# Patient Record
Sex: Male | Born: 1955
Health system: Southern US, Community
[De-identification: ages and names within clinical notes are randomized; demographics above are authoritative.]

## PROBLEM LIST (undated history)

## (undated) DIAGNOSIS — K449 Diaphragmatic hernia without obstruction or gangrene: Secondary | ICD-10-CM

## (undated) DIAGNOSIS — Z87828 Personal history of other (healed) physical injury and trauma: Secondary | ICD-10-CM

## (undated) DIAGNOSIS — M1990 Unspecified osteoarthritis, unspecified site: Secondary | ICD-10-CM

## (undated) DIAGNOSIS — I10 Essential (primary) hypertension: Secondary | ICD-10-CM

## (undated) HISTORY — PX: ROTATOR CUFF REPAIR: SHX139

## (undated) HISTORY — PX: APPENDECTOMY: SHX54

---

## 2003-02-11 ENCOUNTER — Emergency Department (HOSPITAL_COMMUNITY): Admission: EM | Admit: 2003-02-11 | Discharge: 2003-02-11 | Payer: Self-pay | Admitting: Emergency Medicine

## 2003-02-11 ENCOUNTER — Encounter: Payer: Self-pay | Admitting: Emergency Medicine

## 2003-04-24 ENCOUNTER — Encounter: Payer: Self-pay | Admitting: General Surgery

## 2003-04-24 ENCOUNTER — Ambulatory Visit (HOSPITAL_COMMUNITY): Admission: RE | Admit: 2003-04-24 | Discharge: 2003-04-24 | Payer: Self-pay | Admitting: General Surgery

## 2004-04-10 ENCOUNTER — Emergency Department (HOSPITAL_COMMUNITY): Admission: EM | Admit: 2004-04-10 | Discharge: 2004-04-10 | Payer: Self-pay | Admitting: Emergency Medicine

## 2008-02-29 ENCOUNTER — Emergency Department (HOSPITAL_COMMUNITY): Admission: EM | Admit: 2008-02-29 | Discharge: 2008-02-29 | Payer: Self-pay | Admitting: Emergency Medicine

## 2009-09-23 ENCOUNTER — Encounter: Payer: Self-pay | Admitting: Cardiology

## 2009-10-05 ENCOUNTER — Encounter: Payer: Self-pay | Admitting: Cardiology

## 2009-10-19 ENCOUNTER — Ambulatory Visit: Payer: Self-pay | Admitting: Cardiology

## 2009-10-19 DIAGNOSIS — K219 Gastro-esophageal reflux disease without esophagitis: Secondary | ICD-10-CM

## 2009-10-19 DIAGNOSIS — R071 Chest pain on breathing: Secondary | ICD-10-CM | POA: Insufficient documentation

## 2009-10-19 DIAGNOSIS — F17200 Nicotine dependence, unspecified, uncomplicated: Secondary | ICD-10-CM

## 2009-10-19 DIAGNOSIS — E7800 Pure hypercholesterolemia, unspecified: Secondary | ICD-10-CM | POA: Insufficient documentation

## 2009-10-19 DIAGNOSIS — R079 Chest pain, unspecified: Secondary | ICD-10-CM | POA: Insufficient documentation

## 2011-09-11 LAB — D-DIMER, QUANTITATIVE: D-Dimer, Quant: 0.22

## 2011-09-11 LAB — BASIC METABOLIC PANEL
BUN: 11
CO2: 27
Chloride: 106
Creatinine, Ser: 1.05
Glucose, Bld: 102 — ABNORMAL HIGH

## 2011-09-11 LAB — DIFFERENTIAL
Basophils Absolute: 0
Basophils Relative: 1
Eosinophils Absolute: 0.1
Monocytes Absolute: 0.4
Neutrophils Relative %: 50

## 2011-09-11 LAB — CBC
MCHC: 35
MCV: 89.7
Platelets: 217
RDW: 12.2

## 2011-09-11 LAB — POCT CARDIAC MARKERS: Operator id: 228551

## 2012-01-18 ENCOUNTER — Other Ambulatory Visit (HOSPITAL_COMMUNITY): Payer: Self-pay | Admitting: Family Medicine

## 2012-01-18 ENCOUNTER — Ambulatory Visit (HOSPITAL_COMMUNITY)
Admission: RE | Admit: 2012-01-18 | Discharge: 2012-01-18 | Disposition: A | Discharge status: Home / Self Care | Payer: Medicaid Other | Source: Ambulatory Visit | Attending: Family Medicine | Admitting: Family Medicine

## 2012-01-18 DIAGNOSIS — G8929 Other chronic pain: Secondary | ICD-10-CM

## 2012-01-18 DIAGNOSIS — S8990XA Unspecified injury of unspecified lower leg, initial encounter: Secondary | ICD-10-CM | POA: Insufficient documentation

## 2012-01-18 DIAGNOSIS — M898X9 Other specified disorders of bone, unspecified site: Secondary | ICD-10-CM | POA: Insufficient documentation

## 2012-01-18 DIAGNOSIS — W19XXXA Unspecified fall, initial encounter: Secondary | ICD-10-CM | POA: Insufficient documentation

## 2012-01-18 DIAGNOSIS — M25569 Pain in unspecified knee: Secondary | ICD-10-CM | POA: Insufficient documentation

## 2012-01-18 DIAGNOSIS — M25469 Effusion, unspecified knee: Secondary | ICD-10-CM | POA: Insufficient documentation

## 2012-02-17 ENCOUNTER — Emergency Department (HOSPITAL_COMMUNITY)
Admission: EM | Admit: 2012-02-17 | Discharge: 2012-02-17 | Disposition: A | Discharge status: Home / Self Care | Payer: Self-pay | Attending: Emergency Medicine | Admitting: Emergency Medicine

## 2012-02-17 ENCOUNTER — Encounter (HOSPITAL_COMMUNITY): Payer: Self-pay | Admitting: *Deleted

## 2012-02-17 ENCOUNTER — Emergency Department (HOSPITAL_COMMUNITY): Payer: Self-pay

## 2012-02-17 DIAGNOSIS — R0789 Other chest pain: Secondary | ICD-10-CM

## 2012-02-17 DIAGNOSIS — IMO0001 Reserved for inherently not codable concepts without codable children: Secondary | ICD-10-CM | POA: Insufficient documentation

## 2012-02-17 DIAGNOSIS — E119 Type 2 diabetes mellitus without complications: Secondary | ICD-10-CM | POA: Insufficient documentation

## 2012-02-17 DIAGNOSIS — R071 Chest pain on breathing: Secondary | ICD-10-CM | POA: Insufficient documentation

## 2012-02-17 LAB — CBC
HCT: 47.4 % (ref 39.0–52.0)
Hemoglobin: 16.4 g/dL (ref 13.0–17.0)
MCH: 30.7 pg (ref 26.0–34.0)
MCHC: 34.6 g/dL (ref 30.0–36.0)
MCV: 88.6 fL (ref 78.0–100.0)
Platelets: 234 10*3/uL (ref 150–400)
RBC: 5.35 MIL/uL (ref 4.22–5.81)
RDW: 12.2 % (ref 11.5–15.5)
WBC: 4.6 10*3/uL (ref 4.0–10.5)

## 2012-02-17 LAB — DIFFERENTIAL
Basophils Absolute: 0 10*3/uL (ref 0.0–0.1)
Eosinophils Relative: 1 % (ref 0–5)
Lymphocytes Relative: 36 % (ref 12–46)
Monocytes Absolute: 0.4 10*3/uL (ref 0.1–1.0)

## 2012-02-17 LAB — TROPONIN I: Troponin I: <0.3 ng/mL (ref <0.30)

## 2012-02-17 LAB — BASIC METABOLIC PANEL
BUN: 12 mg/dL (ref 6–23)
CO2: 27 mEq/L (ref 19–32)
Calcium: 10.4 mg/dL (ref 8.4–10.5)
Chloride: 100 mEq/L (ref 96–112)
Creatinine, Ser: 1.01 mg/dL (ref 0.50–1.35)
GFR calc Af Amer: >90 mL/min (ref >90)
GFR calc non Af Amer: 81 mL/min — ABNORMAL LOW (ref >90)
Glucose, Bld: 110 mg/dL — ABNORMAL HIGH (ref 70–99)
Potassium: 3.8 mEq/L (ref 3.5–5.1)
Sodium: 136 mEq/L (ref 135–145)

## 2012-02-17 MED ORDER — IBUPROFEN 800 MG PO TABS
800.0000 mg | ORAL_TABLET | Freq: Once | ORAL | Status: AC
  Administered 2012-02-17: 800 mg via ORAL
  Filled 2012-02-17: qty 1

## 2012-02-17 MED ORDER — IBUPROFEN 800 MG PO TABS: 800.0000 mg | ORAL_TABLET | Freq: Three times a day (TID) | ORAL | Status: AC

## 2012-02-17 MED ORDER — HYDROCODONE-ACETAMINOPHEN 5-325 MG PO TABS: 1.0000 | ORAL_TABLET | ORAL | Status: AC | PRN

## 2012-02-17 MED ORDER — HYDROCODONE-ACETAMINOPHEN 5-325 MG PO TABS
1.0000 | ORAL_TABLET | Freq: Once | ORAL | Status: AC
  Administered 2012-02-17: 1 via ORAL
  Filled 2012-02-17: qty 1

## 2012-02-17 NOTE — Discharge Instructions (Signed)
Chest Wall Pain Chest wall pain is pain in or around the bones and muscles of your chest. This may occur:   On its own (spontaneously).   After a viral illness such as the flu.   Through injur.   From coughing.   Minor exercise.  It may take up to 6 weeks to get better; longer if you must stay physically active in your work and activities. HOME CARE INSTRUCTIONS   Avoid over-tiring physical activity. Try not to strain or perform activities which cause pain. This would include any activities using chest, belly (abdominal) and side muscles, especially if heavy weights are used.   Use ice on the painful area for 15 to 20 minutes per hour while awake for the first 2 days. Place the ice in a plastic bag and place a towel between the bag of ice and your skin.   Only take over-the-counter or prescription medicines for pain, discomfort, or fever as directed by your caregiver.  SEEK IMMEDIATE MEDICAL CARE IF:   Your pain increases or you are very uncomfortable.   An oral temperature above 102 F (38.9 C)develops.   Your chest pains become worse.   You develop new, unexplained problems (symptoms).   You develop nausea, vomiting, sweating or feel light headed.   You develop a cough which produces phlegm (sputum) or you cough up blood.  MAKE SURE YOU:   Understand these instructions.   Will watch your condition.   Will get help right away if you are not doing well or get worse.  Document Released: 12/04/2005 Document Revised: 06/19/2011 Document Reviewed: 07/22/2008 [employer] Patient Information 2012 [employer]. 
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
## 2012-02-17 NOTE — ED Notes (Signed)
Pt c/o left sided chest pain that radiates into left axilla, and into sternum.  Pt refers to pain as sharp and pressure like, starting 3 weeks ago.  Pt c/o increased chest /epigastric pain while "toting wood today". Pt cooperative, denies N/V, diaphoresis, arm pain and sob.

## 2012-02-17 NOTE — ED Notes (Signed)
Intermittent CP x 2 weeks radiating to the back at times. Described as pressure pain

## 2012-12-13 ENCOUNTER — Encounter (HOSPITAL_COMMUNITY): Payer: Self-pay | Admitting: *Deleted

## 2012-12-13 ENCOUNTER — Emergency Department (HOSPITAL_COMMUNITY)
Admission: EM | Admit: 2012-12-13 | Discharge: 2012-12-13 | Disposition: A | Discharge status: Home / Self Care | Payer: Self-pay | Attending: Emergency Medicine | Admitting: Emergency Medicine

## 2012-12-13 ENCOUNTER — Emergency Department (HOSPITAL_COMMUNITY): Payer: Self-pay

## 2012-12-13 DIAGNOSIS — R112 Nausea with vomiting, unspecified: Secondary | ICD-10-CM | POA: Insufficient documentation

## 2012-12-13 DIAGNOSIS — E119 Type 2 diabetes mellitus without complications: Secondary | ICD-10-CM | POA: Insufficient documentation

## 2012-12-13 DIAGNOSIS — Z87828 Personal history of other (healed) physical injury and trauma: Secondary | ICD-10-CM | POA: Insufficient documentation

## 2012-12-13 DIAGNOSIS — R091 Pleurisy: Secondary | ICD-10-CM | POA: Insufficient documentation

## 2012-12-13 DIAGNOSIS — Z87891 Personal history of nicotine dependence: Secondary | ICD-10-CM | POA: Insufficient documentation

## 2012-12-13 DIAGNOSIS — Z8719 Personal history of other diseases of the digestive system: Secondary | ICD-10-CM | POA: Insufficient documentation

## 2012-12-13 DIAGNOSIS — Z79899 Other long term (current) drug therapy: Secondary | ICD-10-CM | POA: Insufficient documentation

## 2012-12-13 HISTORY — DX: Personal history of other (healed) physical injury and trauma: Z87.828

## 2012-12-13 HISTORY — DX: Diaphragmatic hernia without obstruction or gangrene: K44.9

## 2012-12-13 LAB — BASIC METABOLIC PANEL
BUN: 11 mg/dL (ref 6–23)
Creatinine, Ser: 0.93 mg/dL (ref 0.50–1.35)
GFR calc Af Amer: >90 mL/min (ref >90)
GFR calc non Af Amer: >90 mL/min (ref >90)

## 2012-12-13 LAB — CBC WITH DIFFERENTIAL/PLATELET
Basophils Relative: 1 % (ref 0–1)
Eosinophils Absolute: 0.2 10*3/uL (ref 0.0–0.7)
Eosinophils Relative: 4 % (ref 0–5)
HCT: 43.7 % (ref 39.0–52.0)
Hemoglobin: 15.2 g/dL (ref 13.0–17.0)
MCH: 30 pg (ref 26.0–34.0)
MCHC: 34.8 g/dL (ref 30.0–36.0)
MCV: 86.4 fL (ref 78.0–100.0)
Monocytes Absolute: 0.5 10*3/uL (ref 0.1–1.0)
Monocytes Relative: 10 % (ref 3–12)

## 2012-12-13 LAB — HEPATIC FUNCTION PANEL
AST: 25 U/L (ref 0–37)
Bilirubin, Direct: <0.1 mg/dL (ref 0.0–0.3)

## 2012-12-13 LAB — D-DIMER, QUANTITATIVE: D-Dimer, Quant: <0.27 ug/mL-FEU (ref 0.00–0.48)

## 2012-12-13 MED ORDER — HYDROCODONE-ACETAMINOPHEN 5-325 MG PO TABS: 1.0000 | ORAL_TABLET | Freq: Three times a day (TID) | ORAL | Status: DC | PRN

## 2012-12-13 MED ORDER — ONDANSETRON 4 MG PO TBDP
4.0000 mg | ORAL_TABLET | Freq: Once | ORAL | Status: AC
  Administered 2012-12-13: 4 mg via ORAL
  Filled 2012-12-13: qty 1

## 2012-12-13 MED ORDER — OXYCODONE-ACETAMINOPHEN 5-325 MG PO TABS
1.0000 | ORAL_TABLET | Freq: Once | ORAL | Status: AC
  Administered 2012-12-13: 1 via ORAL
  Filled 2012-12-13: qty 1

## 2012-12-13 MED ORDER — ONDANSETRON HCL 4 MG/2ML IJ SOLN
4.0000 mg | Freq: Once | INTRAMUSCULAR | Status: DC
  Filled 2012-12-13: qty 2

## 2012-12-13 NOTE — ED Notes (Signed)
MD at bedside. 
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
## 2012-12-13 NOTE — ED Notes (Signed)
Patient given ice chips per nurse's approval.
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
## 2012-12-20 ENCOUNTER — Encounter: Payer: Self-pay | Admitting: Cardiology

## 2012-12-20 ENCOUNTER — Other Ambulatory Visit: Payer: Self-pay | Admitting: *Deleted

## 2012-12-20 ENCOUNTER — Encounter: Payer: Self-pay | Admitting: *Deleted

## 2012-12-20 ENCOUNTER — Ambulatory Visit (INDEPENDENT_AMBULATORY_CARE_PROVIDER_SITE_OTHER): Payer: Self-pay | Admitting: Cardiology

## 2012-12-20 VITALS — BP 144/81 | HR 66 | Ht 76.0 in | Wt 215.0 lb

## 2012-12-20 DIAGNOSIS — F17200 Nicotine dependence, unspecified, uncomplicated: Secondary | ICD-10-CM

## 2012-12-20 DIAGNOSIS — R9431 Abnormal electrocardiogram [ECG] [EKG]: Secondary | ICD-10-CM

## 2012-12-20 DIAGNOSIS — R079 Chest pain, unspecified: Secondary | ICD-10-CM

## 2012-12-20 DIAGNOSIS — R072 Precordial pain: Secondary | ICD-10-CM | POA: Insufficient documentation

## 2012-12-20 DIAGNOSIS — E7800 Pure hypercholesterolemia, unspecified: Secondary | ICD-10-CM
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
## 2012-12-20 NOTE — Patient Instructions (Addendum)
Your physician has requested that you have a stress echocardiogram. For further information please visit [email or web address]. Please follow instruction sheet as given. Your physician recommends that you continue on your current medications as directed. Please refer to the Current Medication list given to you today. We will call you with your results. 

## 2012-12-24 DIAGNOSIS — R072 Precordial pain: Secondary | ICD-10-CM

## 2012-12-31 ENCOUNTER — Telehealth: Payer: Self-pay | Admitting: *Deleted

## 2012-12-31 ENCOUNTER — Encounter: Payer: Self-pay | Admitting: *Deleted

## 2012-12-31 DIAGNOSIS — R079 Chest pain, unspecified: Secondary | ICD-10-CM

## 2012-12-31 DIAGNOSIS — Z01810 Encounter for preprocedural cardiovascular examination: Secondary | ICD-10-CM

## 2012-12-31 NOTE — Telephone Encounter (Signed)
[facility] lab 01/09/13 @9 :30 am with Hochrein -CHECKING PERCERT      ZO:[id number] pain & abnormal stress test                [id number] pain & abnormal stress test

## 2012-12-31 NOTE — Telephone Encounter (Signed)
Message copied by Eustace Moore on Tue Dec 31, 2012  9:20 AM ------      Message from: Lorine Bears A      Created: Fri Dec 27, 2012 11:51 AM       Stress test was abnormal. I already notified Dr. Antoine Poche via a staff message. Please make sure that he is aware because I don't see that the issue has been addressed.

## 2012-12-31 NOTE — Telephone Encounter (Signed)
Message copied by Eustace Moore on Tue Dec 31, 2012  3:06 PM ------      Message from: Rollene Rotunda      Created: Tue Dec 31, 2012  2:30 PM       I spoke with the patient.  He is still getting some chest discomfort with activity. Given this and the abnormal result cardiac catheterization is indicated. The patient understands that risks included but are not limited to stroke (1 in 1000), death (1 in 1000), kidney failure [usually temporary] (1 in 500), bleeding (1 in 200), allergic reaction [possibly serious] (1 in 200).  The patient understands and agrees to proceed.  We will call to arrange.

## 2012-12-31 NOTE — Telephone Encounter (Signed)
Patient is calling for results of stress test. Spoke with MD and is said he would call patient today about the results. Patient informed that MD will be calling him to discuss results today.

## 2012-12-31 NOTE — Telephone Encounter (Signed)
Will you please set up a radial cath with me in the [facility] lab.    Radial  Heart cath in [facility] lab on 01/09/13 @9 :30 am with Dr. Antoine Poche arrive at 8:30 am. Patient informed and is coming to the office on Friday 01/03/13 @10 :00 am for lab orders and cath instructions.

## 2012-12-31 NOTE — Telephone Encounter (Signed)
Does pt have Medicaid or any other insurance?

## 2013-01-03 LAB — PROTIME-INR

## 2013-01-05 ENCOUNTER — Other Ambulatory Visit: Payer: Self-pay | Admitting: Cardiology

## 2013-01-05 DIAGNOSIS — R9439 Abnormal result of other cardiovascular function study: Secondary | ICD-10-CM

## 2013-01-09 ENCOUNTER — Encounter (HOSPITAL_BASED_OUTPATIENT_CLINIC_OR_DEPARTMENT_OTHER)
Admission: RE | Disposition: A | Discharge status: Home / Self Care | Payer: Self-pay | Source: Ambulatory Visit | Attending: Cardiology

## 2013-01-09 ENCOUNTER — Inpatient Hospital Stay (HOSPITAL_BASED_OUTPATIENT_CLINIC_OR_DEPARTMENT_OTHER)
Admission: RE | Admit: 2013-01-09 | Discharge: 2013-01-09 | Disposition: A | Discharge status: Home / Self Care | Payer: Self-pay | Source: Ambulatory Visit | Attending: Cardiology | Admitting: Cardiology

## 2013-01-09 DIAGNOSIS — Z79899 Other long term (current) drug therapy: Secondary | ICD-10-CM | POA: Insufficient documentation

## 2013-01-09 DIAGNOSIS — Z7902 Long term (current) use of antithrombotics/antiplatelets: Secondary | ICD-10-CM | POA: Insufficient documentation

## 2013-01-09 DIAGNOSIS — R0789 Other chest pain: Secondary | ICD-10-CM | POA: Insufficient documentation

## 2013-01-09 DIAGNOSIS — E119 Type 2 diabetes mellitus without complications: Secondary | ICD-10-CM | POA: Insufficient documentation

## 2013-01-09 DIAGNOSIS — R9439 Abnormal result of other cardiovascular function study: Secondary | ICD-10-CM

## 2013-01-09 DIAGNOSIS — R9430 Abnormal result of cardiovascular function study, unspecified: Secondary | ICD-10-CM

## 2013-01-09 DIAGNOSIS — Z7982 Long term (current) use of aspirin: Secondary | ICD-10-CM | POA: Insufficient documentation

## 2013-01-09 DIAGNOSIS — I2510 Atherosclerotic heart disease of native coronary artery without angina pectoris: Secondary | ICD-10-CM | POA: Insufficient documentation

## 2013-01-09 LAB — POCT I-STAT GLUCOSE
Glucose, Bld: 129 mg/dL — ABNORMAL HIGH (ref 70–99)
Operator id: 221371

## 2013-01-09 SURGERY — JV LEFT HEART CATHETERIZATION WITH CORONARY ANGIOGRAM
Anesthesia: Moderate Sedation

## 2013-01-09 MED ORDER — SODIUM CHLORIDE 0.9 % IV SOLN
250.0000 mL | INTRAVENOUS | Status: DC | PRN
  Administered 2013-01-09: 250 mL via INTRAVENOUS

## 2013-01-09 MED ORDER — SODIUM CHLORIDE 0.9 % IJ SOLN
3.0000 mL | INTRAMUSCULAR | Status: DC | PRN
Start: 2013-01-09 — End: 2013-01-09

## 2013-01-09 MED ORDER — ACETAMINOPHEN 325 MG PO TABS: 650.0000 mg | ORAL_TABLET | ORAL | Status: DC | PRN

## 2013-01-09 MED ORDER — ONDANSETRON HCL 4 MG/2ML IJ SOLN: 4.0000 mg | Freq: Four times a day (QID) | INTRAMUSCULAR | Status: DC | PRN

## 2013-01-09 MED ORDER — ASPIRIN 81 MG PO CHEW
324.0000 mg | CHEWABLE_TABLET | ORAL | Status: AC
  Administered 2013-01-09: 324 mg via ORAL

## 2013-01-09 MED ORDER — SODIUM CHLORIDE 0.9 % IJ SOLN: 3.0000 mL | Freq: Two times a day (BID) | INTRAMUSCULAR | Status: DC
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
## 2013-01-09 NOTE — Interval H&P Note (Signed)
History and Physical Interval Note:  01/09/2013 9:44 AM  Mauri Pole  has presented today for surgery, with the diagnosis of CP  The various methods of treatment have been discussed with the patient and family. After consideration of risks, benefits and other options for treatment, the patient has consented to  Procedure(s) (LRB) with comments: [facility] LEFT HEART CATHETERIZATION WITH CORONARY ANGIOGRAM (N/A) as a surgical intervention .  The patient's history has been reviewed, patient examined, no change in status, stable for surgery.  I have reviewed the patient's chart and labs.  Questions were answered to the patient's satisfaction.     Rollene Rotunda

## 2013-01-09 NOTE — CV Procedure (Signed)
  Cardiac Catheterization Procedure Note  Name: TAJE LITTLER MRN: [id number] DOB: 1956/01/26  Procedure: Left Heart Cath, Selective Coronary Angiography, LV angiography  Indication:  Abnormal stress echocardiogram with possible inferior hypokinesis.  Procedural details: The right radial was prepped, draped, and anesthetized with 1% lidocaine. Using modified Seldinger technique, a 5 French sheath was introduced into the right radial artery. Standard Judkins catheters were used for coronary angiography and left ventriculography. Catheter exchanges were performed over a guidewire. There were no immediate procedural complications. The patient was transferred to the post catheterization recovery area for further monitoring.  Procedural Findings:  Hemodynamics:     AO 131/77    LV 136/17   Coronary angiography:   Coronary dominance: Right  Left mainstem:   Normal  Left anterior descending (LAD):   Large and wrapping the apex.  Normal.  D1 large with ostial 25% stenosis  Left circumflex (LCx):  RI moderate sized and normal.  AV groove normal.  3 large to moderate sized OMs all normal.  Right coronary artery (RCA):  Moderate sized.  Normal.  PDA small to moderate sized and normal.  PL x 2 small and normal.  Left ventriculography: Left ventricular systolic function is normal, LVEF is estimated at 55%, there is no significant mitral regurgitation   Final Conclusions:  Minimal coronary artery disease.  Low normal global LV function  Recommendations:   No further cardiac work up.    Fayrene Fearing Wells Gerdeman 01/09/2013, 10:14 AM

## 2013-01-09 NOTE — Progress Notes (Signed)
Allen's test performed on right hand with normal results, spo2 98%.

## 2013-01-09 NOTE — OR Nursing (Signed)
Discharge instructions reviewed and signed, pt stated understanding, ambulated in hall without difficulty, site level 0, transported to friend's car via wheelchair 

## 2013-01-13 ENCOUNTER — Telehealth: Payer: Self-pay | Admitting: Cardiology

## 2013-01-13 NOTE — Telephone Encounter (Signed)
Message copied by Burnice Logan on Mon Jan 13, 2013  4:17 PM ------      Message from: Rollene Rotunda      Created: Sun Jan 12, 2013  9:02 PM       Labs OK.  Call Mr. Mareno with the results and send results to [facility], MD

## 2013-01-13 NOTE — Telephone Encounter (Signed)
Informed pt of normal labs

## 2013-01-24 ENCOUNTER — Ambulatory Visit: Payer: Self-pay | Admitting: Physician Assistant

## 2013-04-05 ENCOUNTER — Emergency Department (HOSPITAL_COMMUNITY): Payer: Self-pay

## 2013-04-05 ENCOUNTER — Emergency Department (HOSPITAL_COMMUNITY)
Admission: EM | Admit: 2013-04-05 | Discharge: 2013-04-05 | Disposition: A | Discharge status: Home / Self Care | Payer: Self-pay | Attending: Emergency Medicine | Admitting: Emergency Medicine

## 2013-04-05 ENCOUNTER — Encounter (HOSPITAL_COMMUNITY): Payer: Self-pay

## 2013-04-05 DIAGNOSIS — Z8719 Personal history of other diseases of the digestive system: Secondary | ICD-10-CM | POA: Insufficient documentation

## 2013-04-05 DIAGNOSIS — Z87891 Personal history of nicotine dependence: Secondary | ICD-10-CM | POA: Insufficient documentation

## 2013-04-05 DIAGNOSIS — Z7982 Long term (current) use of aspirin: Secondary | ICD-10-CM | POA: Insufficient documentation

## 2013-04-05 DIAGNOSIS — E119 Type 2 diabetes mellitus without complications: Secondary | ICD-10-CM | POA: Insufficient documentation

## 2013-04-05 DIAGNOSIS — J40 Bronchitis, not specified as acute or chronic: Secondary | ICD-10-CM

## 2013-04-05 DIAGNOSIS — R05 Cough: Secondary | ICD-10-CM | POA: Insufficient documentation

## 2013-04-05 DIAGNOSIS — I10 Essential (primary) hypertension: Secondary | ICD-10-CM | POA: Insufficient documentation

## 2013-04-05 DIAGNOSIS — Z87828 Personal history of other (healed) physical injury and trauma: Secondary | ICD-10-CM | POA: Insufficient documentation

## 2013-04-05 DIAGNOSIS — Z79899 Other long term (current) drug therapy: Secondary | ICD-10-CM | POA: Insufficient documentation

## 2013-04-05 DIAGNOSIS — R0602 Shortness of breath: Secondary | ICD-10-CM | POA: Insufficient documentation

## 2013-04-05 DIAGNOSIS — R059 Cough, unspecified: Secondary | ICD-10-CM | POA: Insufficient documentation

## 2013-04-05 DIAGNOSIS — J209 Acute bronchitis, unspecified: Secondary | ICD-10-CM | POA: Insufficient documentation

## 2013-04-05 HISTORY — DX: Essential (primary) hypertension: I10

## 2013-04-05 MED ORDER — AMOXICILLIN 500 MG PO CAPS: 500.0000 mg | ORAL_CAPSULE | Freq: Three times a day (TID) | ORAL | Status: DC

## 2013-04-05 NOTE — ED Notes (Signed)
Patient ambulatory to room with steady gait. No respiratory distress.

## 2013-04-05 NOTE — ED Notes (Signed)
Pt reports chest congestion for 1 month.  Sob since this am.  Denies any fever.

## 2013-04-05 NOTE — ED Provider Notes (Signed)
History     CSN: [id number]  Arrival date & time 04/05/13  1242   First MD Initiated Contact with Patient 04/05/13 1311      Chief Complaint  Patient presents with  . Nasal Congestion  . Shortness of Breath    (Consider location/radiation/quality/duration/timing/severity/associated sxs/prior treatment) Patient is a 57 y.o. male presenting with shortness of breath. The history is provided by the patient (pt complains of a cough). No language interpreter was used.  Shortness of Breath Severity:  Moderate Onset quality:  Gradual Timing:  Constant Chronicity:  Recurrent Context: not activity   Associated symptoms: cough   Associated symptoms: no abdominal pain, no chest pain, no headaches and no rash     Past Medical History  Diagnosis Date  . Diabetes mellitus   . H/O chest wall injury   . Hiatal hernia   . Hypertension     Past Surgical History  Procedure Laterality Date  . Appendectomy      No family history on file.  History  Substance Use Topics  . Smoking status: Former Games developer  . Smokeless tobacco: Not on file  . Alcohol Use: No      Review of Systems  Constitutional: Negative for appetite change and fatigue.  HENT: Negative for congestion, sinus pressure and ear discharge.   Eyes: Negative for discharge.  Respiratory: Positive for cough and shortness of breath.   Cardiovascular: Negative for chest pain.  Gastrointestinal: Negative for abdominal pain and diarrhea.  Genitourinary: Negative for frequency and hematuria.  Musculoskeletal: Negative for back pain.  Skin: Negative for rash.  Neurological: Negative for seizures and headaches.  Psychiatric/Behavioral: Negative for hallucinations.    Allergies  Review of patient's allergies indicates no known allergies.  Home Medications   Current Outpatient Rx  Name  Route  Sig  Dispense  Refill  . aspirin EC 81 MG tablet   Oral   Take 81 mg by mouth every other day.         . famotidine (PEPCID)  20 MG tablet   Oral   Take 20 mg by mouth daily.         Marland Kitchen gabapentin (NEURONTIN) 400 MG capsule   Oral   Take 400 mg by mouth every other day.          . metFORMIN (GLUCOPHAGE-XR) 500 MG 24 hr tablet   Oral   Take 500 mg by mouth daily.          Marland Kitchen OVER THE COUNTER MEDICATION   Oral   Take 30 mLs by mouth daily as needed (for congestion). [employer] brand Cough and Cold medicine         . pravastatin (PRAVACHOL) 20 MG tablet   Oral   Take 20 mg by mouth daily.         Marland Kitchen amoxicillin (AMOXIL) 500 MG capsule   Oral   Take 1 capsule (500 mg total) by mouth 3 (three) times daily.   21 capsule   0     BP 137/75  Pulse 85  Temp(Src) 98.4 F (36.9 C) (Oral)  Resp 24  Ht 6\' 4"  (1.93 m)  Wt 225 lb (102.059 kg)  BMI 27.4 kg/m2  SpO2 97%  Physical Exam  Constitutional: He is oriented to person, place, and time. He appears well-developed.  HENT:  Head: Normocephalic.  Eyes: Conjunctivae and EOM are normal. No scleral icterus.  Neck: Neck supple. No thyromegaly present.  Cardiovascular: Normal rate and regular rhythm.  Exam  reveals no gallop and no friction rub.   No murmur heard. Pulmonary/Chest: No stridor. He has no wheezes. He has no rales. He exhibits no tenderness.  Abdominal: He exhibits no distension. There is no tenderness. There is no rebound.  Musculoskeletal: Normal range of motion. He exhibits no edema.  Lymphadenopathy:    He has no cervical adenopathy.  Neurological: He is oriented to person, place, and time. Coordination normal.  Skin: No rash noted. No erythema.  Psychiatric: He has a normal mood and affect. His behavior is normal.    ED Course  Procedures (including critical care time)  Labs Reviewed - No data to display Dg Chest 2 View  04/05/2013  *RADIOLOGY REPORT*  Clinical Data: Shortness of breath, nasal congestion  CHEST - 2 VIEW  Comparison: 12/13/2012  Findings: Lungs are clear.  No pleural effusion or pneumothorax.   Cardiomediastinal silhouette is within normal limits.  Visualized osseous structures are within normal limits.  IMPRESSION: Normal chest radiographs.   Original Report Authenticated By: Charline Bills, M.D.      1. Bronchitis       MDM          [name], MD 04/05/13 1430

## 2013-04-05 NOTE — ED Notes (Signed)
Patient with no complaints at this time. Respirations even and unlabored. Skin warm/dry. Discharge instructions reviewed with patient at this time. Patient given opportunity to voice concerns/ask questions. Patient discharged at this time and left Emergency Department with steady gait.   

## 2013-12-22 ENCOUNTER — Encounter (HOSPITAL_COMMUNITY): Payer: Self-pay | Admitting: Emergency Medicine

## 2013-12-22 ENCOUNTER — Emergency Department (HOSPITAL_COMMUNITY): Payer: Medicaid Other

## 2013-12-22 ENCOUNTER — Emergency Department (HOSPITAL_COMMUNITY)
Admission: EM | Admit: 2013-12-22 | Discharge: 2013-12-22 | Disposition: A | Discharge status: Home / Self Care | Payer: Medicaid Other | Attending: Emergency Medicine | Admitting: Emergency Medicine

## 2013-12-22 DIAGNOSIS — M25529 Pain in unspecified elbow: Secondary | ICD-10-CM | POA: Insufficient documentation

## 2013-12-22 DIAGNOSIS — Z8719 Personal history of other diseases of the digestive system: Secondary | ICD-10-CM | POA: Insufficient documentation

## 2013-12-22 DIAGNOSIS — E119 Type 2 diabetes mellitus without complications: Secondary | ICD-10-CM | POA: Insufficient documentation

## 2013-12-22 DIAGNOSIS — I10 Essential (primary) hypertension: Secondary | ICD-10-CM | POA: Insufficient documentation

## 2013-12-22 DIAGNOSIS — M1710 Unilateral primary osteoarthritis, unspecified knee: Secondary | ICD-10-CM | POA: Insufficient documentation

## 2013-12-22 DIAGNOSIS — IMO0002 Reserved for concepts with insufficient information to code with codable children: Secondary | ICD-10-CM | POA: Insufficient documentation

## 2013-12-22 DIAGNOSIS — Z7982 Long term (current) use of aspirin: Secondary | ICD-10-CM | POA: Insufficient documentation

## 2013-12-22 DIAGNOSIS — Z79899 Other long term (current) drug therapy: Secondary | ICD-10-CM | POA: Insufficient documentation

## 2013-12-22 DIAGNOSIS — Z87828 Personal history of other (healed) physical injury and trauma: Secondary | ICD-10-CM | POA: Insufficient documentation

## 2013-12-22 DIAGNOSIS — Z87891 Personal history of nicotine dependence: Secondary | ICD-10-CM | POA: Insufficient documentation

## 2013-12-22 DIAGNOSIS — M1990 Unspecified osteoarthritis, unspecified site: Secondary | ICD-10-CM

## 2013-12-22 MED ORDER — IBUPROFEN 600 MG PO TABS: 600.0000 mg | ORAL_TABLET | Freq: Four times a day (QID) | ORAL | Status: DC | PRN

## 2013-12-22 MED ORDER — HYDROCODONE-ACETAMINOPHEN 5-325 MG PO TABS
1.0000 | ORAL_TABLET | ORAL | Status: DC | PRN
Start: 2013-12-22 — End: 2014-01-21

## 2013-12-22 MED ORDER — HYDROCODONE-ACETAMINOPHEN 5-325 MG PO TABS: 2.0000 | ORAL_TABLET | ORAL | Status: DC | PRN

## 2013-12-22 NOTE — Discharge Instructions (Signed)
Osteoarthritis Osteoarthritis is the most common form of arthritis. It is redness, soreness, and swelling (inflammation) affecting the cartilage. Cartilage acts as a cushion, covering the ends of bones where they meet to form a joint. CAUSES  Over time, the cartilage begins to wear away. This causes bone to rub on bone. This produces pain and stiffness in the affected joints. Factors that contribute to this problem are:  Excessive body weight.  Age.  Overuse of joints. SYMPTOMS   People with osteoarthritis usually experience joint pain, swelling, or stiffness.  Over time, the joint may lose its normal shape.  Small deposits of bone (osteophytes or bone spurs) may grow on the edges of the joint.  Bits of bone or cartilage can break off and float inside the joint space. This may cause more pain and damage.  Osteoarthritis can lead to depression, anxiety, feelings of helplessness, and limitations on daily activities. The most commonly affected joints are in the:  Ends of the fingers.  Thumbs.  Neck.  Lower back.  Knees.  Hips. DIAGNOSIS  Diagnosis is mostly based on your symptoms and exam. Tests may be helpful, including:  X-rays of the affected joint.  A computerized magnetic scan (MRI).  Blood tests to rule out other types of arthritis.  Joint fluid tests. This involves using a needle to draw fluid from the joint and examining the fluid under a microscope. TREATMENT  Goals of treatment are to control pain, improve joint function, maintain a normal body weight, and maintain a healthy lifestyle. Treatment approaches may include:  A prescribed exercise program with rest and joint relief.  Weight control with nutritional education.  Pain relief techniques such as:  Properly applied heat and cold.  Electric pulses delivered to nerve endings under the skin (transcutaneous electrical nerve stimulation, TENS).  Massage.  Certain supplements. Ask your caregiver before  using any supplements, especially in combination with prescribed drugs.  Medicines to control pain, such as:  Acetaminophen.  Nonsteroidal anti-inflammatory drugs (NSAIDs), such as naproxen.  Narcotic or central-acting agents, such as tramadol. This drug carries a risk of addiction and is generally prescribed for short-term use.  Corticosteroids. These can be given orally or as injection. This is a short-term treatment, not recommended for routine use.  Surgery to reposition the bones and relieve pain (osteotomy) or to remove loose pieces of bone and cartilage. Joint replacement may be needed in advanced states of osteoarthritis. HOME CARE INSTRUCTIONS  Your caregiver can recommend specific types of exercise. These may include:  Strengthening exercises. These are done to strengthen the muscles that support joints affected by arthritis. They can be performed with weights or with exercise bands to add resistance.  Aerobic activities. These are exercises, such as brisk walking or low-impact aerobics, that get your heart pumping. They can help keep your lungs and circulatory system in shape.  Range-of-motion activities. These keep your joints limber.  Balance and agility exercises. These help you maintain daily living skills. Learning about your condition and being actively involved in your care will help improve the course of your osteoarthritis. SEEK MEDICAL CARE IF:   You feel hot or your skin turns red.  You develop a rash in addition to your joint pain.  You have an oral temperature above 102 F (38.9 C). [place] of Arthritis and Musculoskeletal and Skin Diseases: www.niams.SouthExposed.es [employer] on Aging: http://kim-miller.com/ American College of Rheumatology: www.rheumatology.org Document Released: 12/04/2005 Document Revised: 02/26/2012 Document Reviewed: 03/17/2010 ExitCare Patient Information  2014 [employer], [place].  You may take the  hydrocodone prescribed for pain relief.  This will make you drowsy - do not drive within 4 hours of taking this medication.

## 2013-12-22 NOTE — ED Notes (Signed)
Pain lt knee and lt elbow for 2 weeks, no injury

## 2013-12-22 NOTE — ED Provider Notes (Signed)
CSN: [id number]     Arrival date & time 12/22/13  1935 History   First MD Initiated Contact with Patient 12/22/13 2020     Chief Complaint  Patient presents with  . Knee Pain   (Consider location/radiation/quality/duration/timing/severity/associated sxs/prior Treatment) HPI Comments: Joshua Kemp is a 58 y.o. Male presenting with complaint of left medial elbow burning pain which has been constant for the past 2 weeks. He denies injury or prior similar symptoms.  He is not currently working, but stays rather active, denies any activity that might have triggered this pain. He also has complaint of left knee pain which is sharp and worse when he first gets up in the am or after sitting down for a while, then improves after walking.  He is pain free at rest when not weight bearing. He has been told he has a bone spur years ago in the knee.  He has taken no medicines for either complaint.  He denies fevers, chills, chest pain, rash, swelling of either joint.     The history is provided by the patient.    Past Medical History  Diagnosis Date  . Diabetes mellitus   . H/O chest wall injury   . Hiatal hernia   . Hypertension    Past Surgical History  Procedure Laterality Date  . Appendectomy     History reviewed. No pertinent family history. History  Substance Use Topics  . Smoking status: Former Research scientist (life sciences)  . Smokeless tobacco: Not on file  . Alcohol Use: No    Review of Systems  Constitutional: Negative for fever and chills.  Musculoskeletal: Positive for arthralgias. Negative for joint swelling and myalgias.  Skin: Negative for color change, rash and wound.  Neurological: Negative for weakness and numbness.    Allergies  Review of patient's allergies indicates no known allergies.  Home Medications   Current Outpatient Rx  Name  Route  Sig  Dispense  Refill  . aspirin EC 81 MG tablet   Oral   Take 81 mg by mouth every other day.         . famotidine (PEPCID) 20 MG  tablet   Oral   Take 20 mg by mouth daily.         Marland Kitchen gabapentin (NEURONTIN) 400 MG capsule   Oral   Take 400 mg by mouth every other day.          . ibuprofen (ADVIL,MOTRIN) 800 MG tablet   Oral   Take 800 mg by mouth every 8 (eight) hours as needed.         . metFORMIN (GLUCOPHAGE) 1000 MG tablet   Oral   Take 1,000 mg by mouth 2 (two) times daily.         Marland Kitchen HYDROcodone-acetaminophen (NORCO/VICODIN) 5-325 MG per tablet   Oral   Take 2 tablets by mouth every 4 (four) hours as needed.   6 tablet   0   . HYDROcodone-acetaminophen (NORCO/VICODIN) 5-325 MG per tablet   Oral   Take 1 tablet by mouth every 4 (four) hours as needed for moderate pain.   20 tablet   0   . ibuprofen (ADVIL,MOTRIN) 600 MG tablet   Oral   Take 1 tablet (600 mg total) by mouth every 6 (six) hours as needed.   30 tablet   0    BP 148/93  Pulse 62  Temp(Src) 97.9 F (36.6 C) (Oral)  Resp 20  Ht 6\' 4"  (1.93 m)  Wt 212 lb (  96.163 kg)  BMI 25.82 kg/m2  SpO2 97% Physical Exam  Constitutional: He appears well-developed and well-nourished.  HENT:  Head: Atraumatic.  Neck: Normal range of motion.  Cardiovascular:  Pulses equal bilaterally  Musculoskeletal: He exhibits tenderness. He exhibits no edema.       Left elbow: He exhibits normal range of motion, no swelling, no effusion and no deformity. Tenderness found. Medial epicondyle tenderness noted.       Left knee: He exhibits decreased range of motion and bony tenderness. He exhibits no swelling, no effusion, no deformity, no erythema, normal alignment, no LCL laxity and no MCL laxity.  TTP superior lateral patella and posterior popliteal space. No effusion. No calf or thigh tenderness.  Neurological: He is alert. He has normal strength. He displays normal reflexes. No sensory deficit.  Equal strength  Skin: Skin is warm and dry.  Psychiatric: He has a normal mood and affect.    ED Course  Procedures (including critical care  time) Labs Review Labs Reviewed - No data to display Imaging Review Dg Elbow Complete Left  12/22/2013   CLINICAL DATA:  Elbow pain.  EXAM: LEFT ELBOW - COMPLETE 3+ VIEW  COMPARISON:  None.  FINDINGS: Osteophytes and degenerative changes involving the coronoid process of the ulna. There is no evidence for a joint effusion. Elbow is located. Negative for an acute fracture.  IMPRESSION: Degenerative changes without acute bone abnormality.   Electronically Signed   By: Joshua Kemp M.D.   On: 12/22/2013 21:44   Dg Knee Complete 4 Views Left  12/22/2013   CLINICAL DATA:  Left knee pain.  EXAM: LEFT KNEE - COMPLETE 4+ VIEW  COMPARISON:  01/18/2012  FINDINGS: Osteophytes and degenerative changes in the medial knee compartment and patellofemoral compartment. Negative for a fracture or dislocation. No evidence for a large joint effusion.  IMPRESSION: Degenerative changes in the left knee.  No acute bone abnormality.   Electronically Signed   By: Joshua Kemp M.D.   On: 12/22/2013 21:43    EKG Interpretation   None       MDM   1. Osteoarthritis (arthritis due to wear and tear of joints)    Heat tx,  Ibuprofen, hydrocodone.  Referral to ortho for further evaluation/management.  No rash, redness, swelling, doubt gout, infection.    Joshua Jefferson, PA-C 12/22/13 2216

## 2013-12-22 NOTE — ED Provider Notes (Signed)
Medical screening examination/treatment/procedure(s) were performed by non-physician practitioner and as supervising physician I was immediately available for consultation/collaboration.  EKG Interpretation   None         Mervin Kung, MD 12/22/13 2322

## 2013-12-30 MED FILL — Hydrocodone-Acetaminophen Tab 5-325 MG: ORAL | Qty: 6 | Status: AC

## 2014-01-11 IMAGING — CR DG KNEE COMPLETE 4+V*L*
4 series · 4 of 4 positions shown · non-contrast
Comparison: 01/18/2012

CLINICAL DATA: Left knee pain.

EXAM:
LEFT KNEE - COMPLETE 4+ VIEW

[view not recorded (1 of 4)]
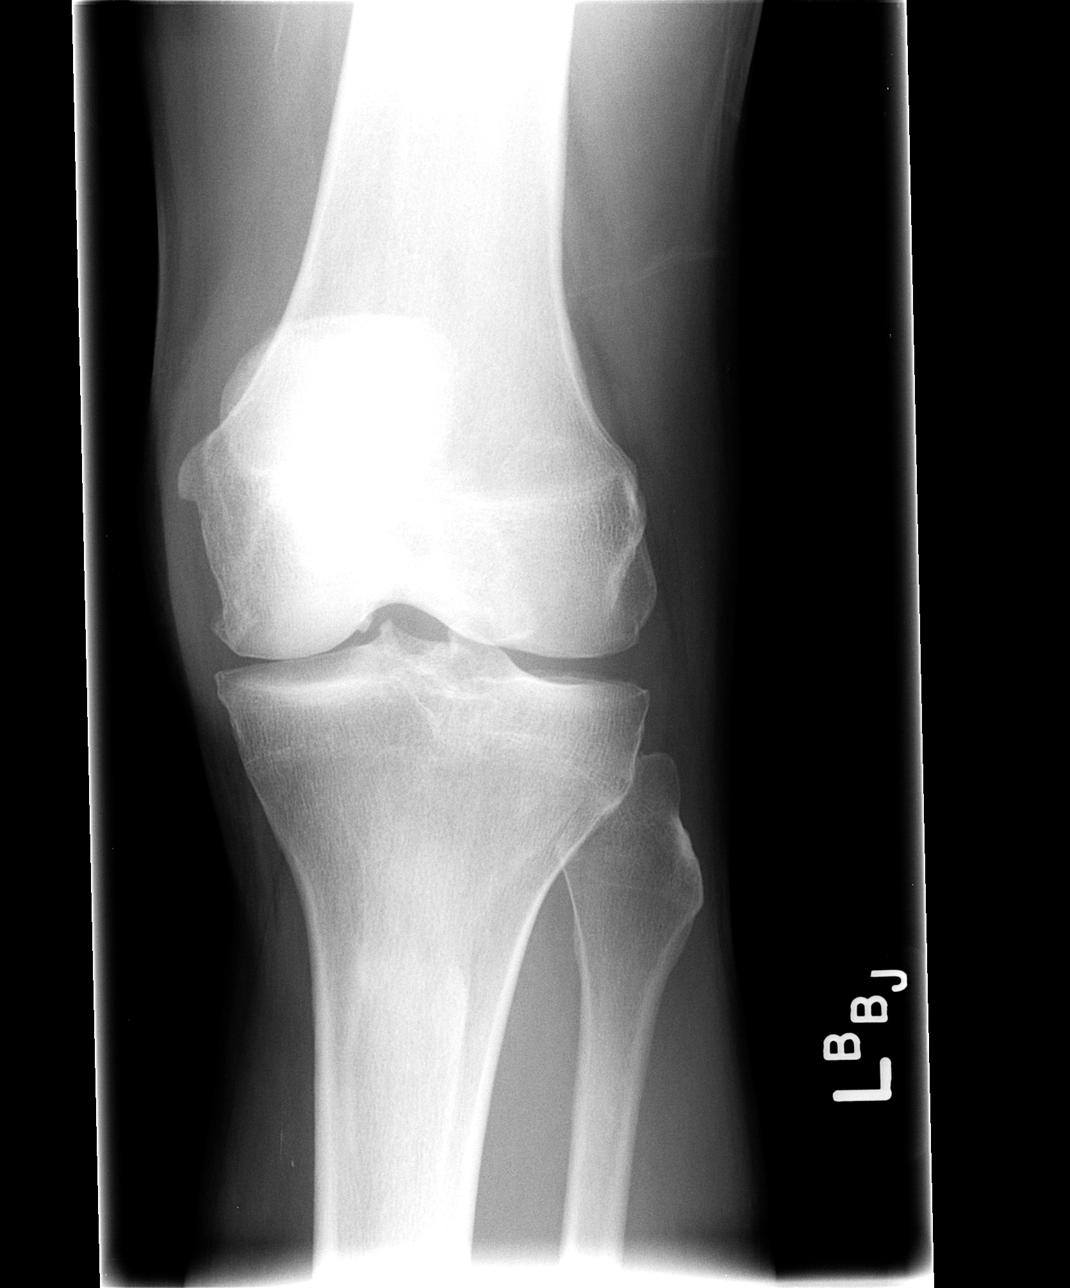

[view not recorded (2 of 4)]
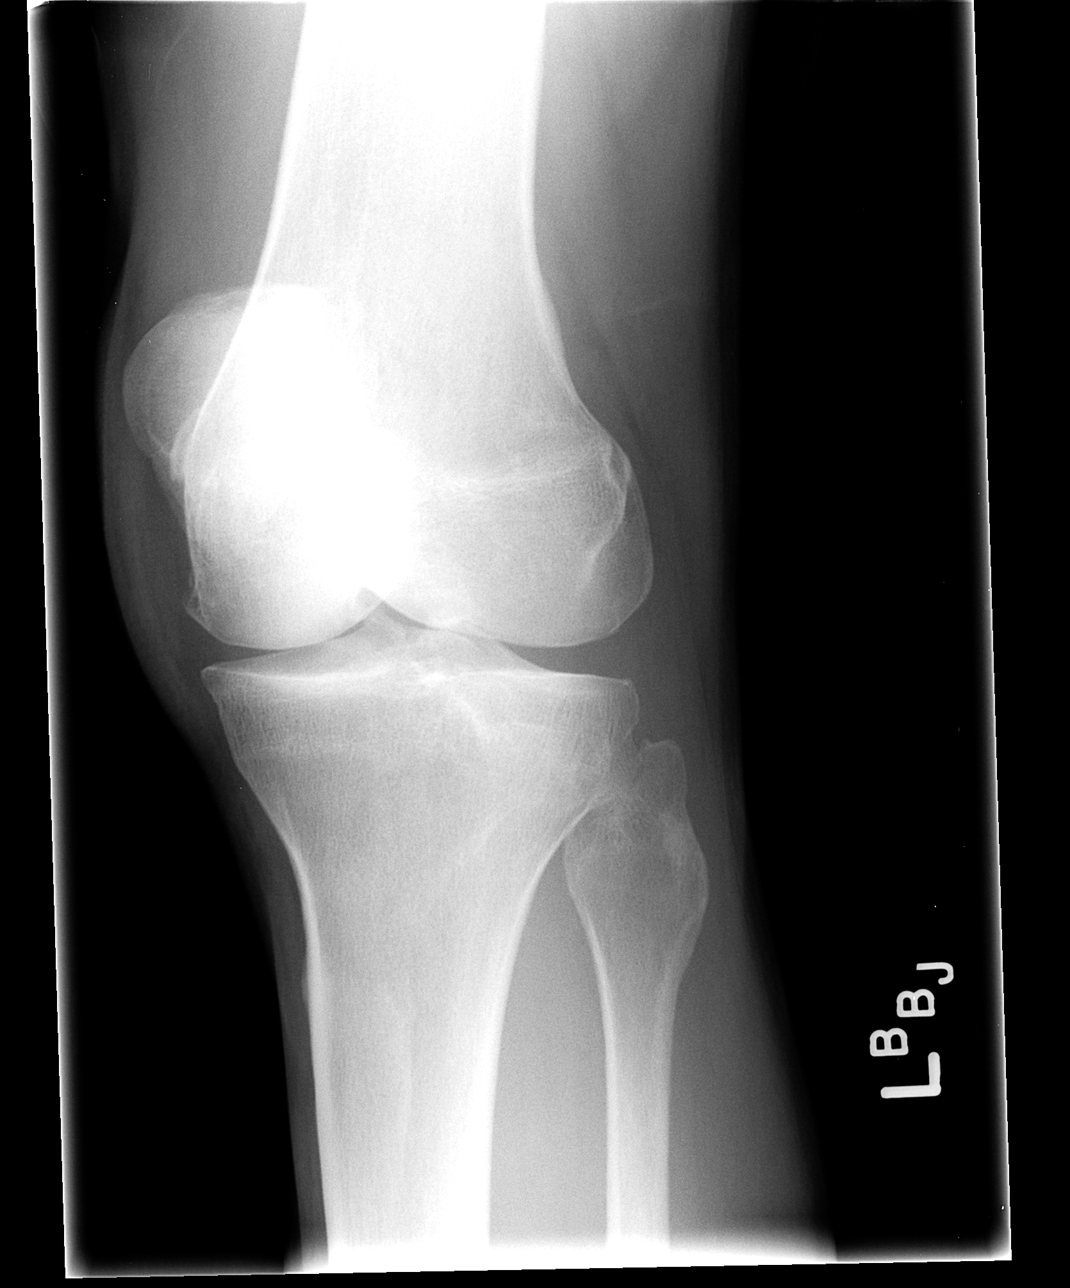

[view not recorded (3 of 4)]
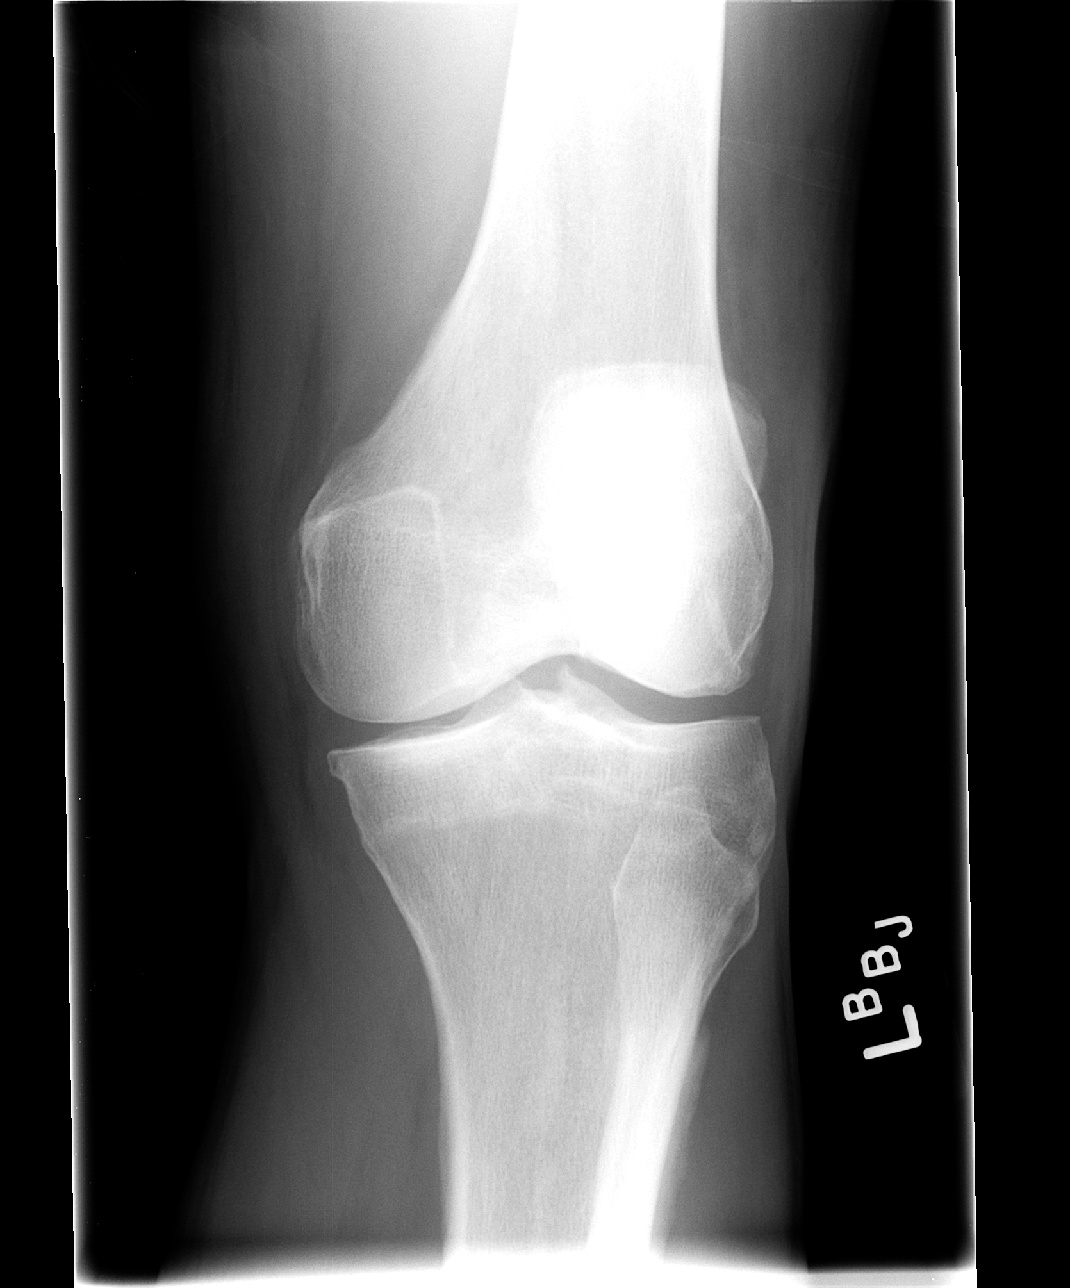

[view not recorded (4 of 4)]
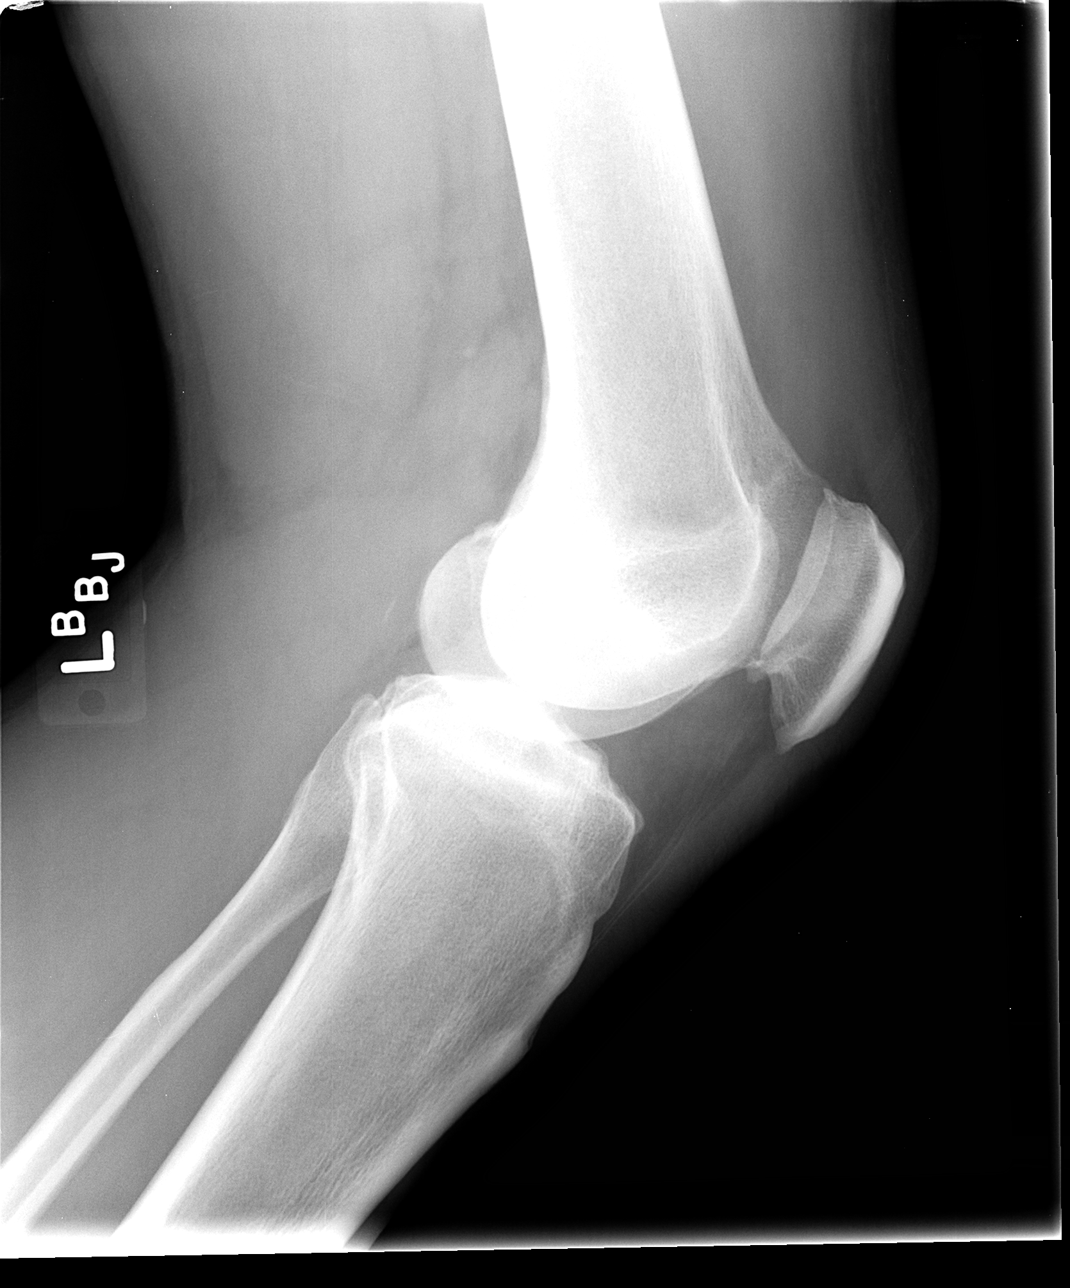

[4 of 4 positions shown; findings below may reference images not displayed]

FINDINGS: Osteophytes and degenerative changes in the medial knee compartment
and patellofemoral compartment. Negative for a fracture or
dislocation. No evidence for a large joint effusion.
IMPRESSION: Degenerative changes in the left knee.  No acute bone abnormality.

## 2014-01-21 ENCOUNTER — Telehealth: Payer: Self-pay | Admitting: *Deleted

## 2014-01-21 ENCOUNTER — Ambulatory Visit (INDEPENDENT_AMBULATORY_CARE_PROVIDER_SITE_OTHER): Payer: Medicaid Other | Admitting: Orthopedic Surgery

## 2014-01-21 ENCOUNTER — Encounter: Payer: Self-pay | Admitting: Orthopedic Surgery

## 2014-01-21 ENCOUNTER — Other Ambulatory Visit: Payer: Self-pay | Admitting: *Deleted

## 2014-01-21 VITALS — BP 146/90 | Ht 76.0 in | Wt 217.0 lb

## 2014-01-21 DIAGNOSIS — M179 Osteoarthritis of knee, unspecified: Secondary | ICD-10-CM | POA: Insufficient documentation

## 2014-01-21 DIAGNOSIS — G5620 Lesion of ulnar nerve, unspecified upper limb: Secondary | ICD-10-CM

## 2014-01-21 DIAGNOSIS — M1710 Unilateral primary osteoarthritis, unspecified knee: Secondary | ICD-10-CM

## 2014-01-21 DIAGNOSIS — IMO0002 Reserved for concepts with insufficient information to code with codable children: Secondary | ICD-10-CM

## 2014-01-21 MED ORDER — HYDROCODONE-ACETAMINOPHEN 5-325 MG PO TABS: 1.0000 | ORAL_TABLET | Freq: Four times a day (QID) | ORAL | Status: DC | PRN

## 2014-01-21 MED ORDER — IBUPROFEN 800 MG PO TABS: 800.0000 mg | ORAL_TABLET | Freq: Three times a day (TID) | ORAL | Status: DC | PRN

## 2014-01-21 NOTE — Progress Notes (Signed)
Patient ID: Joshua Kemp, male   DOB: Apr 09, 1956, 58 y.o.   MRN: [id number]  Chief Complaint  Patient presents with  . Knee Pain    Left knee pain, no injury Referred by Dr. Fredirick Maudlin    HISTORY: 58 year old male presents with atypical pain in his left knee laterally and medially which is mostly associated with standing up from a seated position. He seems to have pain when he first stands up and then as he walks things seemed to get better. He does report some locking catching swelling has medial joint line pain and lateral pain over his iliotibial band with painful extension of the knee. Pain is 8/10 pain is controlled somewhat with ibuprofen and hydrocodone  Review of systems heartburn joint pain swelling muscle pain and redness of his eyes otherwise normal  Past Medical History  Diagnosis Date  . Diabetes mellitus   . H/O chest wall injury   . Hiatal hernia   . Hypertension     Vital signs BP 146/90  Ht 6\' 4"  (1.93 m)  Wt 217 lb (98.431 kg)  BMI 26.43 kg/m2   General appearance: the patient is well-developed and well-nourished, grooming and hygiene are normal, body habitus normal  The patient is alert and oriented x 3; mood and affect are normal  Ambulatory status labored  Left knee Inspection medial joint line tenderness tenderness over the lateral collateral ligament and iliotibial band Range of motion painless passive range of motion The Lachman test is normal the anterior and posterior drawer tests are normal and the collateral ligaments are stable Motor exam 5/5 Skin normal; no rash or laceration  McMurray's sign negative Right knee appears to have no real abnormalities. Functional range of motion no swelling no ligamentous instability no muscle tone abnormalities skin intact  Right upper extremity normal as well. The left elbow Inspection revealed medial epicondylar tenderness, ROM was normal and motor exam grade 5/5 quad strength. Ligaments were stable    Cardiovascular exam normal pulse and perfusion without edema tenderness or varicose veins  Sensory exam is normal in the upper and lower extremity on the left  Encounter Diagnosis  Name Primary?  Marland Kitchen Arthritis of knee, degenerative Yes   Cubital tunnel syndrome  Recommend nerve conduction study to check for cubital tunnel syndrome I will call him with the results he is to call us after the test is complete so we can get the result  Continue ibuprofen and Norco for pain full arthritic knee Knee  Injection Procedure Note  Pre-operative Diagnosis: left knee oa  Post-operative Diagnosis: same  Indications: pain  Anesthesia: ethyl chloride   Procedure Details   Verbal consent was obtained for the procedure. Time out was completed.The joint was prepped with alcohol, followed by  Ethyl chloride spray and A 20 gauge needle was inserted into the knee via lateral approach; 47ml 1% lidocaine and 1 ml of depomedrol  was then injected into the joint . The needle was removed and the area cleansed and dressed.  Complications:  None; patient tolerated the procedure well.

## 2014-01-21 NOTE — Telephone Encounter (Signed)
Referral and office notes faxed to Dr. Merlene Laughter. Awaiting appointment. NPI # [id number] received from [facility] to use for referral.

## 2014-01-21 NOTE — Patient Instructions (Addendum)

## 2014-01-28 ENCOUNTER — Ambulatory Visit: Payer: Medicaid Other | Admitting: Orthopedic Surgery

## 2014-01-28 NOTE — Telephone Encounter (Signed)
Appointment with Dr. Merlene Laughter is 02/11/14 at 11:00 am.

## 2014-03-04 ENCOUNTER — Telehealth: Payer: Self-pay | Admitting: Orthopedic Surgery

## 2014-03-04 NOTE — Telephone Encounter (Signed)
Joshua Kemp was seen for left knee 01/21/14 and received an injection.  He called today to schedule an appointment because the injection did not help Told him I will have to check before scheduling another appointment for this knee.  Please advise

## 2014-03-05 NOTE — Telephone Encounter (Addendum)
Tried to call patient to schedule appointment, no answer voice mail not set up. [place] [facility] authorized For 6 visits by [place] B. At Dr. Cathey Endow office  NPI [id number]

## 2014-03-05 NOTE — Telephone Encounter (Signed)
He has [employer], with Dr. Berdine Addison as PCP

## 2014-03-05 NOTE — Telephone Encounter (Signed)
Update insur info if possible   Schedule an appointment in 2 weeks

## 2014-03-05 NOTE — Telephone Encounter (Signed)
Is he uninsured ?

## 2014-03-23 NOTE — Telephone Encounter (Signed)
No response or call back from patient.

## 2014-04-29 ENCOUNTER — Emergency Department (HOSPITAL_COMMUNITY): Payer: Medicare Other

## 2014-04-29 ENCOUNTER — Emergency Department (HOSPITAL_COMMUNITY)
Admission: EM | Admit: 2014-04-29 | Discharge: 2014-04-29 | Disposition: A | Discharge status: Home / Self Care | Payer: Medicare Other | Attending: Emergency Medicine | Admitting: Emergency Medicine

## 2014-04-29 ENCOUNTER — Encounter (HOSPITAL_COMMUNITY): Payer: Self-pay | Admitting: Emergency Medicine

## 2014-04-29 DIAGNOSIS — M1710 Unilateral primary osteoarthritis, unspecified knee: Secondary | ICD-10-CM | POA: Diagnosis not present

## 2014-04-29 DIAGNOSIS — Z87828 Personal history of other (healed) physical injury and trauma: Secondary | ICD-10-CM | POA: Diagnosis not present

## 2014-04-29 DIAGNOSIS — I10 Essential (primary) hypertension: Secondary | ICD-10-CM | POA: Diagnosis not present

## 2014-04-29 DIAGNOSIS — Z791 Long term (current) use of non-steroidal anti-inflammatories (NSAID): Secondary | ICD-10-CM | POA: Diagnosis not present

## 2014-04-29 DIAGNOSIS — M179 Osteoarthritis of knee, unspecified: Secondary | ICD-10-CM

## 2014-04-29 DIAGNOSIS — M25569 Pain in unspecified knee: Secondary | ICD-10-CM | POA: Diagnosis present

## 2014-04-29 DIAGNOSIS — IMO0002 Reserved for concepts with insufficient information to code with codable children: Secondary | ICD-10-CM | POA: Diagnosis not present

## 2014-04-29 DIAGNOSIS — Z87891 Personal history of nicotine dependence: Secondary | ICD-10-CM | POA: Insufficient documentation

## 2014-04-29 DIAGNOSIS — Z79899 Other long term (current) drug therapy: Secondary | ICD-10-CM | POA: Insufficient documentation

## 2014-04-29 DIAGNOSIS — E119 Type 2 diabetes mellitus without complications: Secondary | ICD-10-CM | POA: Insufficient documentation

## 2014-04-29 DIAGNOSIS — Z7982 Long term (current) use of aspirin: Secondary | ICD-10-CM | POA: Diagnosis not present

## 2014-04-29 LAB — CBG MONITORING, ED: GLUCOSE-CAPILLARY: 157 mg/dL — AB (ref 70–99)

## 2014-04-29 MED ORDER — IBUPROFEN 800 MG PO TABS: 800.0000 mg | ORAL_TABLET | Freq: Three times a day (TID) | ORAL | Status: DC | PRN

## 2014-04-29 NOTE — ED Notes (Signed)
Patient states that he stomped at a snake with his left foot and knee has been hurting ever since.

## 2014-04-29 NOTE — ED Provider Notes (Signed)
Medical screening examination/treatment/procedure(s) were performed by non-physician practitioner and as supervising physician I was immediately available for consultation/collaboration.   EKG Interpretation None      Rolland Porter, MD, Abram Sander  Janice Norrie, MD 04/29/14 [phone]

## 2014-04-29 NOTE — ED Provider Notes (Signed)
CSN: [id number]     Arrival date & time 04/29/14  1524 History   First MD Initiated Contact with Patient 04/29/14 1542     Chief Complaint  Patient presents with  . Knee Pain     (Consider location/radiation/quality/duration/timing/severity/associated sxs/prior Treatment) HPI Comments: Patient is 58 year old male with PMHx significant for DM and HTN who presents to the ED with left knee pain - he has seen Dr. Aline Brochure (last visit in March where he was injected with steroids) for problems with the same knee and states that he has "spurs" in the knee - states that had been doing well but yesterday he stomped on a snake with the left foot and since then he has had an increase in his pain - he reports pain worse with flexion and weight bearing.  He denies swelling, numbness or tingling.  He has been ambulatory since the event.  He has not followed up with Dr. Aline Brochure because they were supposed to call him with follow up appointment.  Patient is a 58 y.o. male presenting with knee pain. The history is provided by the patient. No language interpreter was used.  Knee Pain Location:  Knee Time since incident:  1 day Injury: yes   Mechanism of injury: crush   Crush injury:    Mechanism:  Unable to specify   Duration of crushing force:  1 day Knee location:  L knee Pain details:    Quality:  Aching   Radiates to:  Does not radiate   Severity:  Severe   Onset quality:  Sudden   Timing:  Constant   Progression:  Worsening Chronicity:  Recurrent Dislocation: no   Foreign body present:  No foreign bodies Tetanus status:  Up to date Prior injury to area:  Yes Relieved by:  Nothing Worsened by:  Flexion and bearing weight Ineffective treatments:  None tried Associated symptoms: decreased ROM and stiffness   Associated symptoms: no back pain, no fever, no muscle weakness, no numbness, no swelling and no tingling     Past Medical History  Diagnosis Date  . Diabetes mellitus   . H/O chest  wall injury   . Hiatal hernia   . Hypertension    Past Surgical History  Procedure Laterality Date  . Appendectomy     History reviewed. No pertinent family history. History  Substance Use Topics  . Smoking status: Former Research scientist (life sciences)  . Smokeless tobacco: Not on file  . Alcohol Use: No    Review of Systems  Constitutional: Negative for fever.  Musculoskeletal: Positive for stiffness. Negative for back pain.  All other systems reviewed and are negative.     Allergies  Review of patient's allergies indicates no known allergies.  Home Medications   Prior to Admission medications   Medication Sig Start Date End Date Taking? Authorizing Provider  aspirin EC 81 MG tablet Take 81 mg by mouth every other day.    Historical Provider, MD  famotidine (PEPCID) 20 MG tablet Take 20 mg by mouth daily.    Historical Provider, MD  gabapentin (NEURONTIN) 400 MG capsule Take 400 mg by mouth every other day.     Historical Provider, MD  HYDROcodone-acetaminophen (NORCO/VICODIN) 5-325 MG per tablet Take 1 tablet by mouth every 6 (six) hours as needed for moderate pain. 01/21/14   Carole Civil, MD  ibuprofen (ADVIL,MOTRIN) 800 MG tablet Take 1 tablet (800 mg total) by mouth every 8 (eight) hours as needed. 01/21/14   Carole Civil,  MD  metFORMIN (GLUCOPHAGE) 1000 MG tablet Take 1,000 mg by mouth 2 (two) times daily.    Historical Provider, MD   BP 139/79  Pulse 77  Temp(Src) 96.9 F (36.1 C) (Oral)  Ht 6\' 4"  (1.93 m)  Wt 215 lb (97.523 kg)  BMI 26.18 kg/m2  SpO2 96% Physical Exam  Nursing note and vitals reviewed. Constitutional: He is oriented to person, place, and time. He appears well-developed and well-nourished. No distress.  HENT:  Head: Normocephalic and atraumatic.  Mouth/Throat: Oropharynx is clear and moist.  Eyes: Conjunctivae are normal. No scleral icterus.  Neck: Normal range of motion. Neck supple.  Pulmonary/Chest: Effort normal.  Musculoskeletal:       Right knee:  He exhibits normal range of motion, no swelling, no effusion, normal alignment, no LCL laxity and no MCL laxity. No tenderness found. No patellar tendon tenderness noted.       Left knee: He exhibits bony tenderness. He exhibits normal range of motion, no swelling, no effusion, no ecchymosis, no erythema, normal alignment, no LCL laxity and no MCL laxity. Tenderness found. Medial joint line and lateral joint line tenderness noted. No patellar tendon tenderness noted.       Legs: Neurological: He is alert and oriented to person, place, and time. He exhibits normal muscle tone. Coordination normal.  Skin: Skin is warm and dry. No rash noted. No erythema. No pallor.  Psychiatric: He has a normal mood and affect. His behavior is normal. Judgment and thought content normal.    ED Course  Procedures (including critical care time) Labs Review Labs Reviewed  CBG MONITORING, ED - Abnormal; Notable for the following:    Glucose-Capillary 157 (*)    All other components within normal limits    Imaging Review No results found.   EKG Interpretation None      Results for orders placed during the hospital encounter of 04/29/14  CBG MONITORING, ED      Result Value Ref Range   Glucose-Capillary 157 (*) 70 - 99 mg/dL   Dg Knee Complete 4 Views Left  04/29/2014   CLINICAL DATA:  Left knee pain.  EXAM: LEFT KNEE - COMPLETE 4+ VIEW  COMPARISON:  December 22, 2013.  FINDINGS: There is no evidence of fracture, dislocation, or joint effusion. Mild narrowing of medial joint space is noted with minimal osteophyte formation. Soft tissues are unremarkable.  IMPRESSION: Mild degenerative joint disease seen medially. No acute abnormality seen in the left knee. No significant change compared to prior exam.   Electronically Signed   By: Sabino Dick M.D.   On: 04/29/2014 16:18      MDM   Left knee pain  Patient returns with acute exacerbation of left knee pain - has previously seen Dr. Aline Brochure for this, had  steroid injection and will be following up with him.  Plan to place in knee immobilizer and give anti-inflammatories for pain and inflammation.    Idalia Needle Joelyn Oms, [place] 04/29/14 1633

## 2014-04-29 NOTE — Discharge Instructions (Signed)
Arthritis, Nonspecific °Arthritis is inflammation of a joint. This usually means pain, redness, warmth or swelling are present. One or more joints may be involved. There are a number of types of arthritis. Your caregiver may not be able to tell what type of arthritis you have right away. °CAUSES  °The most common cause of arthritis is the wear and tear on the joint (osteoarthritis). This causes damage to the cartilage, which can break down over time. The knees, hips, back and neck are most often affected by this type of arthritis. °Other types of arthritis and common causes of joint pain include: °· Sprains and other injuries near the joint. Sometimes minor sprains and injuries cause pain and swelling that develop hours later. °· Rheumatoid arthritis. This affects hands, feet and knees. It usually affects both sides of your body at the same time. It is often associated with chronic ailments, fever, weight loss and general weakness. °· Crystal arthritis. Gout and pseudo gout can cause occasional acute severe pain, redness and swelling in the foot, ankle, or knee. °· Infectious arthritis. Bacteria can get into a joint through a break in overlying skin. This can cause infection of the joint. Bacteria and viruses can also spread through the blood and affect your joints. °· Drug, infectious and allergy reactions. Sometimes joints can become mildly painful and slightly swollen with these types of illnesses. °SYMPTOMS  °· Pain is the main symptom. °· Your joint or joints can also be red, swollen and warm or hot to the touch. °· You may have a fever with certain types of arthritis, or even feel overall ill. °· The joint with arthritis will hurt with movement. Stiffness is present with some types of arthritis. °DIAGNOSIS  °Your caregiver will suspect arthritis based on your description of your symptoms and on your exam. Testing may be needed to find the type of arthritis: °· Blood and sometimes urine tests. °· X-ray tests  and sometimes CT or MRI scans. °· Removal of fluid from the joint (arthrocentesis) is done to check for bacteria, crystals or other causes. Your caregiver (or a specialist) will numb the area over the joint with a local anesthetic, and use a needle to remove joint fluid for examination. This procedure is only minimally uncomfortable. °· Even with these tests, your caregiver may not be able to tell what kind of arthritis you have. Consultation with a specialist (rheumatologist) may be helpful. °TREATMENT  °Your caregiver will discuss with you treatment specific to your type of arthritis. If the specific type cannot be determined, then the following general recommendations may apply. °Treatment of severe joint pain includes: °· Rest. °· Elevation. °· Anti-inflammatory medication (for example, ibuprofen) may be prescribed. Avoiding activities that cause increased pain. °· Only take over-the-counter or prescription medicines for pain and discomfort as recommended by your caregiver. °· Cold packs over an inflamed joint may be used for 10 to 15 minutes every hour. Hot packs sometimes feel better, but do not use overnight. Do not use hot packs if you are diabetic without your caregiver's permission. °· A cortisone shot into arthritic joints may help reduce pain and swelling. °· Any acute arthritis that gets worse over the next 1 to 2 days needs to be looked at to be sure there is no joint infection. °Long-term arthritis treatment involves modifying activities and lifestyle to reduce joint stress jarring. This can include weight loss. Also, exercise is needed to nourish the joint cartilage and remove waste. This helps keep the muscles   around the joint strong. HOME CARE INSTRUCTIONS   Do not take aspirin to relieve pain if gout is suspected. This elevates uric acid levels.  Only take over-the-counter or prescription medicines for pain, discomfort or fever as directed by your caregiver.  Rest the joint as much as  possible.  If your joint is swollen, keep it elevated.  Use crutches if the painful joint is in your leg.  Drinking plenty of fluids may help for certain types of arthritis.  Follow your caregiver's dietary instructions.  Try low-impact exercise such as:  Swimming.  Water aerobics.  Biking.  Walking.  Morning stiffness is often relieved by a warm shower.  Put your joints through regular range-of-motion. SEEK MEDICAL CARE IF:   You do not feel better in 24 hours or are getting worse.  You have side effects to medications, or are not getting better with treatment. SEEK IMMEDIATE MEDICAL CARE IF:   You have a fever.  You develop severe joint pain, swelling or redness.  Many joints are involved and become painful and swollen.  There is severe back pain and/or leg weakness.  You have loss of bowel or bladder control. Document Released: 01/11/2005 Document Revised: 02/26/2012 Document Reviewed: 01/27/2009 [facility] Patient Information 2014 [place].  Wear and Tear Disorders of the Knee (Arthritis, Osteoarthritis) Everyone will experience wear and tear injuries (arthritis, osteoarthritis) of the knee. These are the changes we all get as we age. They come from the joint stress of daily living. The amount of cartilage damage in your knee and your symptoms determine if you need surgery. Mild problems require approximately two months recovery time. More severe problems take several months to recover. With mild problems, your surgeon may find worn and rough cartilage surfaces. With severe changes, your surgeon may find cartilage that has completely worn away and exposed the bone. Loose bodies of bone and cartilage, bone spurs (excess bone growth), and injuries to the menisci (cushions between the large bones of your leg) are also common. All of these problems can cause pain. For a mild wear and tear problem, rough cartilage may simply need to be shaved and smoothed. For more  severe problems with areas of exposed bone, your surgeon may use an instrument for roughing up the bone surfaces to stimulate new cartilage growth. Loose bodies are usually removed. Torn menisci may be trimmed or repaired. ABOUT THE ARTHROSCOPIC PROCEDURE Arthroscopy is a surgical technique. It allows your orthopedic surgeon to diagnose and treat your knee injury with accuracy. The surgeon looks into your knee through a small scope. The scope is like a small (pencil-sized) telescope. Arthroscopy is less invasive than open knee surgery. You can expect a more rapid recovery. After the procedure, you will be moved to a recovery area until most of the effects of the medication have worn off. Your caregiver will discuss the test results with you. RECOVERY The severity of the arthritis and the type of procedure performed will determine recovery time. Other important factors include age, physical condition, medical conditions, and the type of rehabilitation program. Strengthening your muscles after arthroscopy helps guarantee a better recovery. Follow your caregiver's instructions. Use crutches, rest, elevate, ice, and do knee exercises as instructed. Your caregivers will help you and instruct you with exercises and other physical therapy required to regain your mobility, muscle strength, and functioning following surgery. Only take over-the-counter or prescription medicines for pain, discomfort, or fever as directed by your caregiver.  SEEK MEDICAL CARE IF:   There  is increased bleeding (more than a small spot) from the wound.  You notice redness, swelling, or increasing pain in the wound.  Pus is coming from wound.  You develop an unexplained oral temperature above 102 F (38.9 C) , or as your caregiver suggests.  You notice a foul smell coming from the wound or dressing.  You have severe pain with motion of the knee. SEEK IMMEDIATE MEDICAL CARE IF:   You develop a rash.  You have difficulty  breathing.  You have any allergic problems. MAKE SURE YOU:   Understand these instructions.  Will watch your condition.  Will get help right away if you are not doing well or get worse. Document Released: 12/01/2000 Document Revised: 02/26/2012 Document Reviewed: 04/29/2008 [facility] Patient Information 2014 [place], [place].

## 2014-05-01 ENCOUNTER — Telehealth: Payer: Self-pay | Admitting: Orthopedic Surgery

## 2014-05-01 NOTE — Telephone Encounter (Signed)
Call received from patient following visit to [facility] Emergency room for new injury to left knee - states "was stomping a snake."  Had Xrays there.  Patient was seen here for left knee 01/21/14; offered an appointment following receipt of a new referral from primary care physician, due to new injury, which his insurance requires. Patient to contact his physician and will await referral. His Ph# [phone].

## 2014-05-15 DIAGNOSIS — E119 Type 2 diabetes mellitus without complications: Secondary | ICD-10-CM | POA: Diagnosis not present

## 2014-05-15 DIAGNOSIS — M25569 Pain in unspecified knee: Secondary | ICD-10-CM | POA: Diagnosis not present

## 2014-06-12 NOTE — Telephone Encounter (Signed)
Patient has been scheduled, aware of appointment

## 2014-06-16 ENCOUNTER — Ambulatory Visit (INDEPENDENT_AMBULATORY_CARE_PROVIDER_SITE_OTHER): Payer: Medicare Other | Admitting: Orthopedic Surgery

## 2014-06-16 VITALS — BP 144/92 | Ht 76.0 in | Wt 215.0 lb

## 2014-06-16 DIAGNOSIS — M25469 Effusion, unspecified knee: Secondary | ICD-10-CM | POA: Diagnosis not present

## 2014-06-16 DIAGNOSIS — M5410 Radiculopathy, site unspecified: Secondary | ICD-10-CM | POA: Insufficient documentation

## 2014-06-16 DIAGNOSIS — M25462 Effusion, left knee: Secondary | ICD-10-CM

## 2014-06-16 DIAGNOSIS — IMO0002 Reserved for concepts with insufficient information to code with codable children: Secondary | ICD-10-CM | POA: Diagnosis not present

## 2014-06-16 DIAGNOSIS — M1710 Unilateral primary osteoarthritis, unspecified knee: Secondary | ICD-10-CM

## 2014-06-16 DIAGNOSIS — M1712 Unilateral primary osteoarthritis, left knee: Secondary | ICD-10-CM | POA: Insufficient documentation

## 2014-06-16 NOTE — Progress Notes (Signed)
Patient ID: Joshua Kemp, male   DOB: 10/04/1956, 58 y.o.   MRN: [id number]  Chief Complaint  Patient presents with  . Knee Pain    er follow up left knee pain, new injury 04/29/14    HISTORY: 58 year old male had something fall on his left knee and then had a second injury when he started stomping snake. His left knee pain and swelling began after a heavy object fell onto his left knee. Then after he stopped snake he started having pain radiating from his left hip to his left knee and this all started in May. Complaint a 9/10 pain with swelling of the left knee and also has lateral leg and hip pain but denies back pain. Most of the pain is on the side of his left leg running up to the hip and down to the knee. He denies surgical history but reports gastroesophageal reflux disease diabetes arthritis fractures heart attack hypertension he says is on no medications he has a family history diabetes hypertension and heart attack his review of systems he listed as negative although that is questionable  Vital signs: BP 144/92  Ht 6\' 4"  (1.93 m)  Wt 215 lb (97.523 kg)  BMI 26.18 kg/m2   General the patient is well-developed and well-nourished grooming and hygiene are normal Oriented x3 Mood and affect normal Ambulation he does have a limp favoring his left leg Inspection of the left knee shows joint effusion moderate painful range of motion with periarticular tenderness but joint stability. Motor exam is normal skin is clean dry and intact no rash. No puncture wounds. Pulses are good sensation is normal. Full range of motion All joints are stable Motor exam is normal Skin clean dry and intact  Cardiovascular exam is normal Sensory exam normal  Diagnosis joint effusion left knee Radicular pain left hip left leg  Recommend aspiration injection left knee for joint effusion his x-rays showed mild arthritis  Recommend steroid Dosepak for his radicular symptoms  Follow as  needed   Procedure note  Knee  Injection and aspiration Procedure Note  Pre-operative Diagnosis: right  knee oa, effusion  Post-operative Diagnosis: same  Indications: pain, swelling  Anesthesia: ethyl chloride   Procedure Details   Verbal consent was obtained for the procedure. Time out was completed.The joint was prepped with alcohol, followed by  Ethyl chloride spray and The 18-gauge needle was inserted into the joint via lateral approach and we aspirated approximately 40 cc of clear yellow fluid  This was followed by the injection of 54ml 1% lidocaine and 1 ml of depomedrol  was then injected into the joint . The needle was removed and the area cleansed and dressed.  Complications:  None; patient tolerated the procedure well.

## 2014-06-16 NOTE — Patient Instructions (Signed)

## 2014-08-17 DIAGNOSIS — E119 Type 2 diabetes mellitus without complications: Secondary | ICD-10-CM | POA: Diagnosis not present

## 2014-08-17 DIAGNOSIS — IMO0002 Reserved for concepts with insufficient information to code with codable children: Secondary | ICD-10-CM | POA: Diagnosis not present

## 2014-09-09 DIAGNOSIS — M25569 Pain in unspecified knee: Secondary | ICD-10-CM | POA: Diagnosis not present

## 2014-09-09 DIAGNOSIS — E119 Type 2 diabetes mellitus without complications: Secondary | ICD-10-CM | POA: Diagnosis not present

## 2014-09-26 ENCOUNTER — Emergency Department (HOSPITAL_COMMUNITY): Payer: Medicare Other

## 2014-09-26 ENCOUNTER — Encounter (HOSPITAL_COMMUNITY): Payer: Self-pay | Admitting: Emergency Medicine

## 2014-09-26 ENCOUNTER — Emergency Department (HOSPITAL_COMMUNITY)
Admission: EM | Admit: 2014-09-26 | Discharge: 2014-09-26 | Disposition: A | Discharge status: Home / Self Care | Payer: Medicare Other | Attending: Emergency Medicine | Admitting: Emergency Medicine

## 2014-09-26 DIAGNOSIS — I10 Essential (primary) hypertension: Secondary | ICD-10-CM | POA: Insufficient documentation

## 2014-09-26 DIAGNOSIS — R0789 Other chest pain: Secondary | ICD-10-CM | POA: Diagnosis not present

## 2014-09-26 DIAGNOSIS — Z87891 Personal history of nicotine dependence: Secondary | ICD-10-CM | POA: Diagnosis not present

## 2014-09-26 DIAGNOSIS — J40 Bronchitis, not specified as acute or chronic: Secondary | ICD-10-CM | POA: Diagnosis not present

## 2014-09-26 DIAGNOSIS — Z7982 Long term (current) use of aspirin: Secondary | ICD-10-CM | POA: Diagnosis not present

## 2014-09-26 DIAGNOSIS — Z8719 Personal history of other diseases of the digestive system: Secondary | ICD-10-CM | POA: Diagnosis not present

## 2014-09-26 DIAGNOSIS — Z791 Long term (current) use of non-steroidal anti-inflammatories (NSAID): Secondary | ICD-10-CM | POA: Diagnosis not present

## 2014-09-26 DIAGNOSIS — R079 Chest pain, unspecified: Secondary | ICD-10-CM | POA: Diagnosis not present

## 2014-09-26 DIAGNOSIS — Z87828 Personal history of other (healed) physical injury and trauma: Secondary | ICD-10-CM | POA: Diagnosis not present

## 2014-09-26 DIAGNOSIS — E119 Type 2 diabetes mellitus without complications: Secondary | ICD-10-CM | POA: Diagnosis not present

## 2014-09-26 DIAGNOSIS — R05 Cough: Secondary | ICD-10-CM | POA: Diagnosis present

## 2014-09-26 DIAGNOSIS — Z79899 Other long term (current) drug therapy: Secondary | ICD-10-CM | POA: Insufficient documentation

## 2014-09-26 DIAGNOSIS — J209 Acute bronchitis, unspecified: Secondary | ICD-10-CM | POA: Diagnosis not present

## 2014-09-26 LAB — TROPONIN I

## 2014-09-26 MED ORDER — HYDROCOD POLST-CHLORPHEN POLST 10-8 MG/5ML PO LQCR
5.0000 mL | Freq: Once | ORAL | Status: AC
  Administered 2014-09-26: 5 mL via ORAL
  Filled 2014-09-26: qty 5

## 2014-09-26 MED ORDER — PROMETHAZINE-CODEINE 6.25-10 MG/5ML PO SYRP: 5.0000 mL | ORAL_SOLUTION | Freq: Four times a day (QID) | ORAL | Status: DC | PRN

## 2014-09-26 MED ORDER — ALBUTEROL SULFATE HFA 108 (90 BASE) MCG/ACT IN AERS
2.0000 | INHALATION_SPRAY | Freq: Once | RESPIRATORY_TRACT | Status: AC
  Administered 2014-09-26: 2 via RESPIRATORY_TRACT
  Filled 2014-09-26: qty 6.7

## 2014-09-26 NOTE — ED Provider Notes (Signed)
CSN: [id number]     Arrival date & time 09/26/14  2031 History   First MD Initiated Contact with Patient 09/26/14 2117     Chief Complaint  Patient presents with  . Cough     (Consider location/radiation/quality/duration/timing/severity/associated sxs/prior Treatment) HPI Comments: Patient is a 58 year old African American male who presents to the emergency department with the complaint of a cough. The patient states that he got a flu shot nearly a month ago, and shortly after he felt as though he was coming down with something. He began having problems with cough. The patient states that he is coughing up a" thick yellow mucus". He denies coughing up any blood he material. He has not had any high fevers to be reported. The patient states he does have from time to time some pain in his anterior chest sometimes related to cough and sometimes at rest. He has had some sweating at times. He has not had any loss of consciousness, and no nausea or vomiting reported. Patient states that the cough is interfering with his rest at times and he would like to have it evaluated.  The history is provided by the patient.    Past Medical History  Diagnosis Date  . Diabetes mellitus   . H/O chest wall injury   . Hiatal hernia   . Hypertension    Past Surgical History  Procedure Laterality Date  . Appendectomy     History reviewed. No pertinent family history. History  Substance Use Topics  . Smoking status: Former Research scientist (life sciences)  . Smokeless tobacco: Not on file  . Alcohol Use: No    Review of Systems  Constitutional: Negative for activity change.       All ROS Neg except as noted in HPI  HENT: Positive for congestion.   Eyes: Negative for photophobia and discharge.  Respiratory: Positive for cough. Negative for shortness of breath and wheezing.   Cardiovascular: Positive for chest pain. Negative for palpitations and leg swelling.  Gastrointestinal: Negative for abdominal pain and blood in stool.   Genitourinary: Negative for dysuria, frequency and hematuria.  Musculoskeletal: Negative for arthralgias, back pain and neck pain.  Skin: Negative.   Neurological: Negative for dizziness, seizures and speech difficulty.  Psychiatric/Behavioral: Negative for hallucinations and confusion.      Allergies  Review of patient's allergies indicates no known allergies.  Home Medications   Prior to Admission medications   Medication Sig Start Date End Date Taking? Authorizing Provider  aspirin EC 81 MG tablet Take 81 mg by mouth every other day.    Historical Provider, MD  famotidine (PEPCID) 20 MG tablet Take 20 mg by mouth daily.    Historical Provider, MD  gabapentin (NEURONTIN) 400 MG capsule Take 400 mg by mouth at bedtime as needed (pain).     Historical Provider, MD  ibuprofen (ADVIL,MOTRIN) 800 MG tablet Take 1 tablet (800 mg total) by mouth every 8 (eight) hours as needed for moderate pain. 04/29/14   Idalia Needle. [place], PA-C  metFORMIN (GLUCOPHAGE) 1000 MG tablet Take 1,000 mg by mouth 2 (two) times daily.    Historical Provider, MD  OMEPRAZOLE PO Take 1 capsule by mouth daily.    Historical Provider, MD   BP 147/84  Pulse 65  Temp(Src) 98.1 F (36.7 C) (Oral)  Resp 18  Ht 6\' 4"  (1.93 m)  Wt 215 lb (97.523 kg)  BMI 26.18 kg/m2  SpO2 99% Physical Exam  Nursing note and vitals reviewed. Constitutional: He is oriented to  person, place, and time. He appears well-developed and well-nourished.  Non-toxic appearance.  HENT:  Head: Normocephalic.  Right Ear: Tympanic membrane and external ear normal.  Left Ear: Tympanic membrane and external ear normal.  Eyes: EOM and lids are normal. Pupils are equal, round, and reactive to light.  Neck: Normal range of motion. Neck supple. Carotid bruit is not present.  Cardiovascular: Normal rate, regular rhythm, normal heart sounds, intact distal pulses and normal pulses.   Pulmonary/Chest: No respiratory distress. He has no wheezes. He has  rhonchi.  Coarse breath sounds noted throughout. No wheezes appreciated. Symmetrical rise and fall of the chest is noted.  Minimal chest wall tenderness.  Abdominal: Soft. Bowel sounds are normal. There is no tenderness. There is no guarding.  Musculoskeletal: Normal range of motion.  Lymphadenopathy:       Head (right side): No submandibular adenopathy present.       Head (left side): No submandibular adenopathy present.    He has no cervical adenopathy.  Neurological: He is alert and oriented to person, place, and time. He has normal strength. No cranial nerve deficit or sensory deficit.  Skin: Skin is warm and dry.  Psychiatric: He has a normal mood and affect. His speech is normal.    ED Course  Procedures (including critical care time) Labs Review Labs Reviewed - No data to display  Imaging Review Dg Chest 2 View  09/26/2014   CLINICAL DATA:  Subacute onset of productive cough for 1 month. Chest pain and burning while coughing. History of diabetes and hypertension. Initial encounter.  EXAM: CHEST  2 VIEW  COMPARISON:  Chest radiograph performed 04/05/2013  FINDINGS: The lungs are well-aerated and clear. There is no evidence of focal opacification, pleural effusion or pneumothorax.  The heart is normal in size; the mediastinal contour is within normal limits. No acute osseous abnormalities are seen.  IMPRESSION: No acute cardiopulmonary process seen.   Electronically Signed   By: Garald Balding M.D.   On: 09/26/2014 21:27     EKG Interpretation None      MDM  Patient presents to the emergency department with one month of cough. The patient states that he at times has anterior chest pain, mostly with cough, but sometimes without cough. He also states that at times he has some sweating. Patient has history of diabetes mellitus. Electrocardiogram will be obtained to rule out any cardiac involvement in this pain.  Chest x-ray is negative for acute problem.   Electrocardiogram  reveals some T-wave inversion in the inferior leads. This is similar to previous electrocardiograms. No acute event noted on tonight's EKG.   Pt to increase fluids, use tylenol for fever or chills, Rx for promethazine-codeine cough medication given to the patient.   Final diagnoses:  Bronchitis    *I have reviewed nursing notes, vital signs, and all appropriate lab and imaging results for this patient.Lenox Ahr, PA-C 09/28/14 [phone]

## 2014-09-26 NOTE — Discharge Instructions (Signed)
Your chest x-ray is negative for pneumonia, collapsed lung, or other problems. Your heart is test was negative for any acute event. Please use albuterol 2 puffs every 4 hours. Please use promethazine codeine cough medication for cough. Please see Dr. Berdine Addison for additional evaluation if cough continues.

## 2014-09-26 NOTE — ED Notes (Signed)
Pt was specifically asked on several occasions about whether he was having chest pain. Pt denied any "continuous" chest pain. Pt stated that when he coughed hard, he would have pain in his chest so an xray was ordered.

## 2014-09-26 NOTE — ED Notes (Signed)
Pt states he got the flu shot about 1 month ago. Pt states 2 weeks later, he began coughing and coughing up thick yellow mucous.

## 2014-09-29 NOTE — ED Provider Notes (Signed)
Medical screening examination/treatment/procedure(s) were performed by non-physician practitioner and as supervising physician I was immediately available for consultation/collaboration.   EKG Interpretation   Date/Time:  Saturday September 26 2014 21:50:43 EDT Ventricular Rate:  81 PR Interval:  150 QRS Duration: 88 QT Interval:  394 QTC Calculation: 457 R Axis:   13 Text Interpretation:  Sinus rhythm with frequent Premature ventricular  complexes T wave abnormality, consider inferior ischemia Abnormal ECG  Confirmed by Beau Fanny  MD, Swain [place] ([id number]) on 09/26/2014 11:12:11 PM       Veryl Speak, MD 09/29/14 0730

## 2014-10-16 IMAGING — CR DG CHEST 2V
2 series · 2 of 2 positions shown · non-contrast
Comparison: Chest radiograph performed 04/05/2013

CLINICAL DATA: Subacute onset of productive cough for 1 month.
Chest pain and burning while coughing. History of diabetes and
hypertension. Initial encounter.

EXAM:
CHEST  2 VIEW

[view not recorded (1 of 2)]
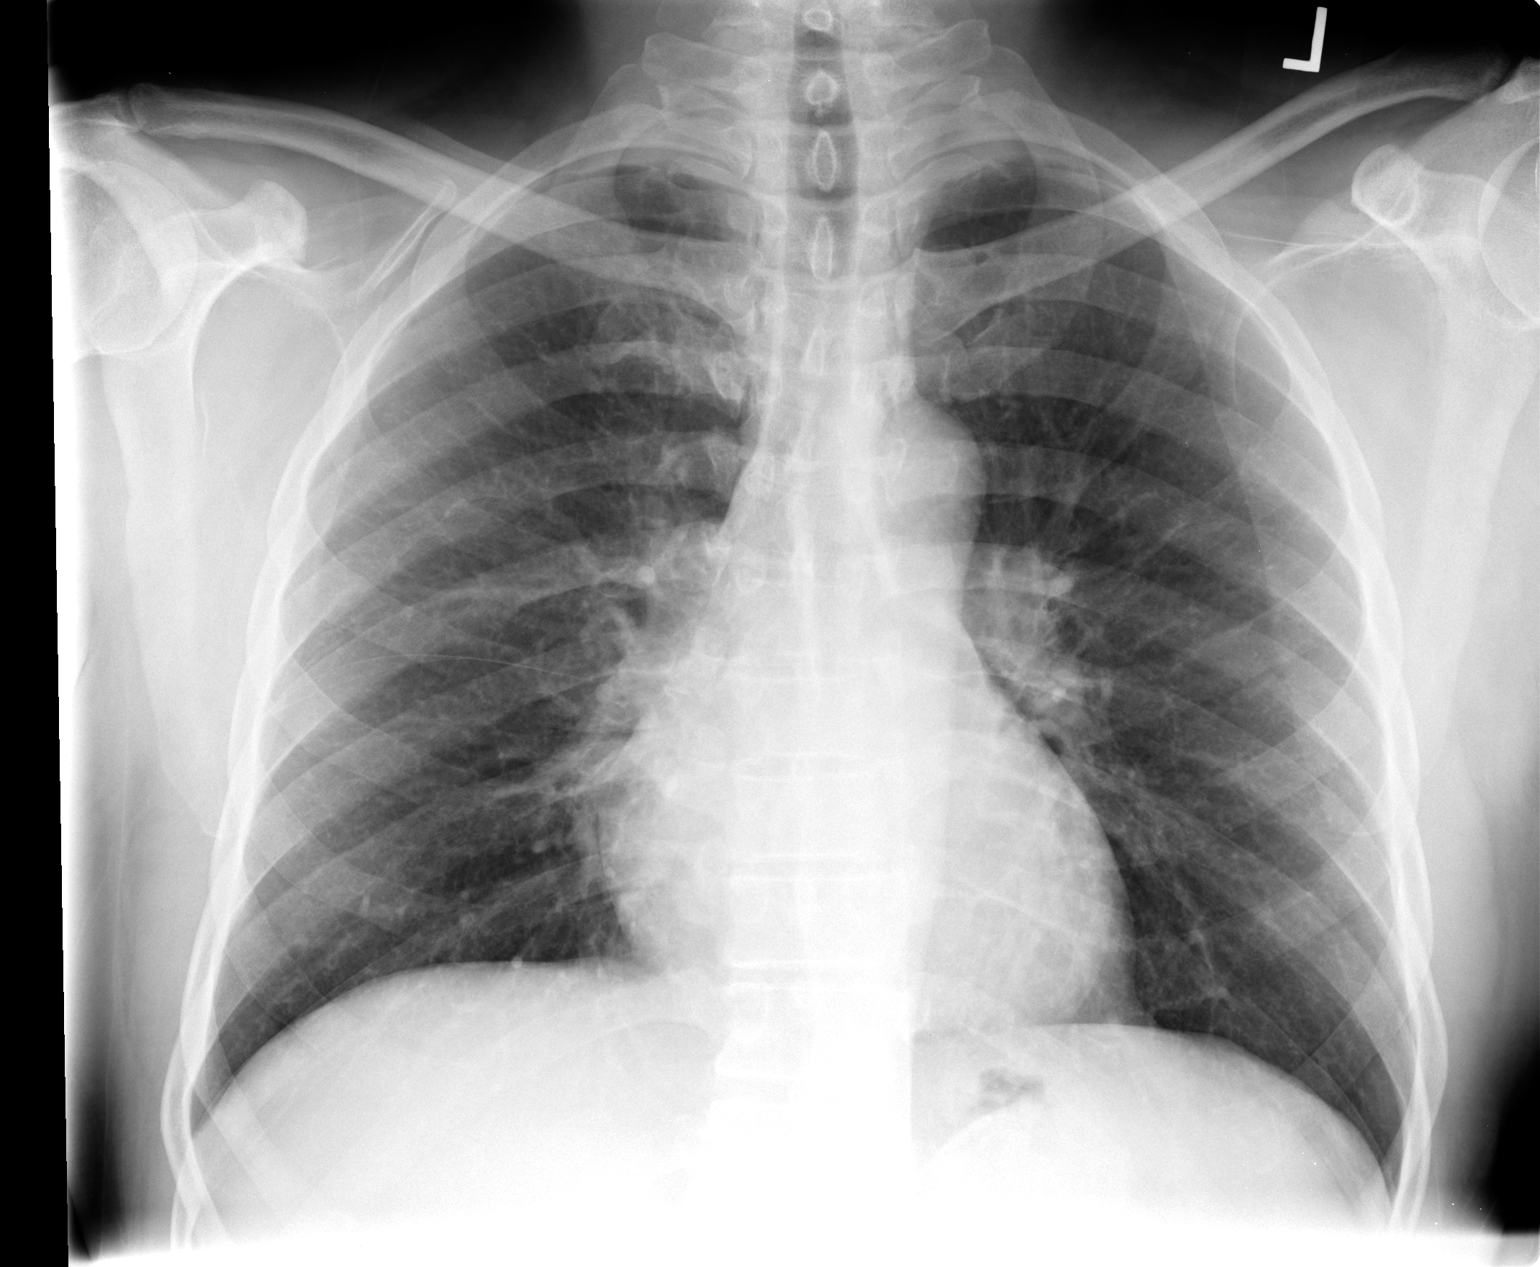

[view not recorded (2 of 2)]
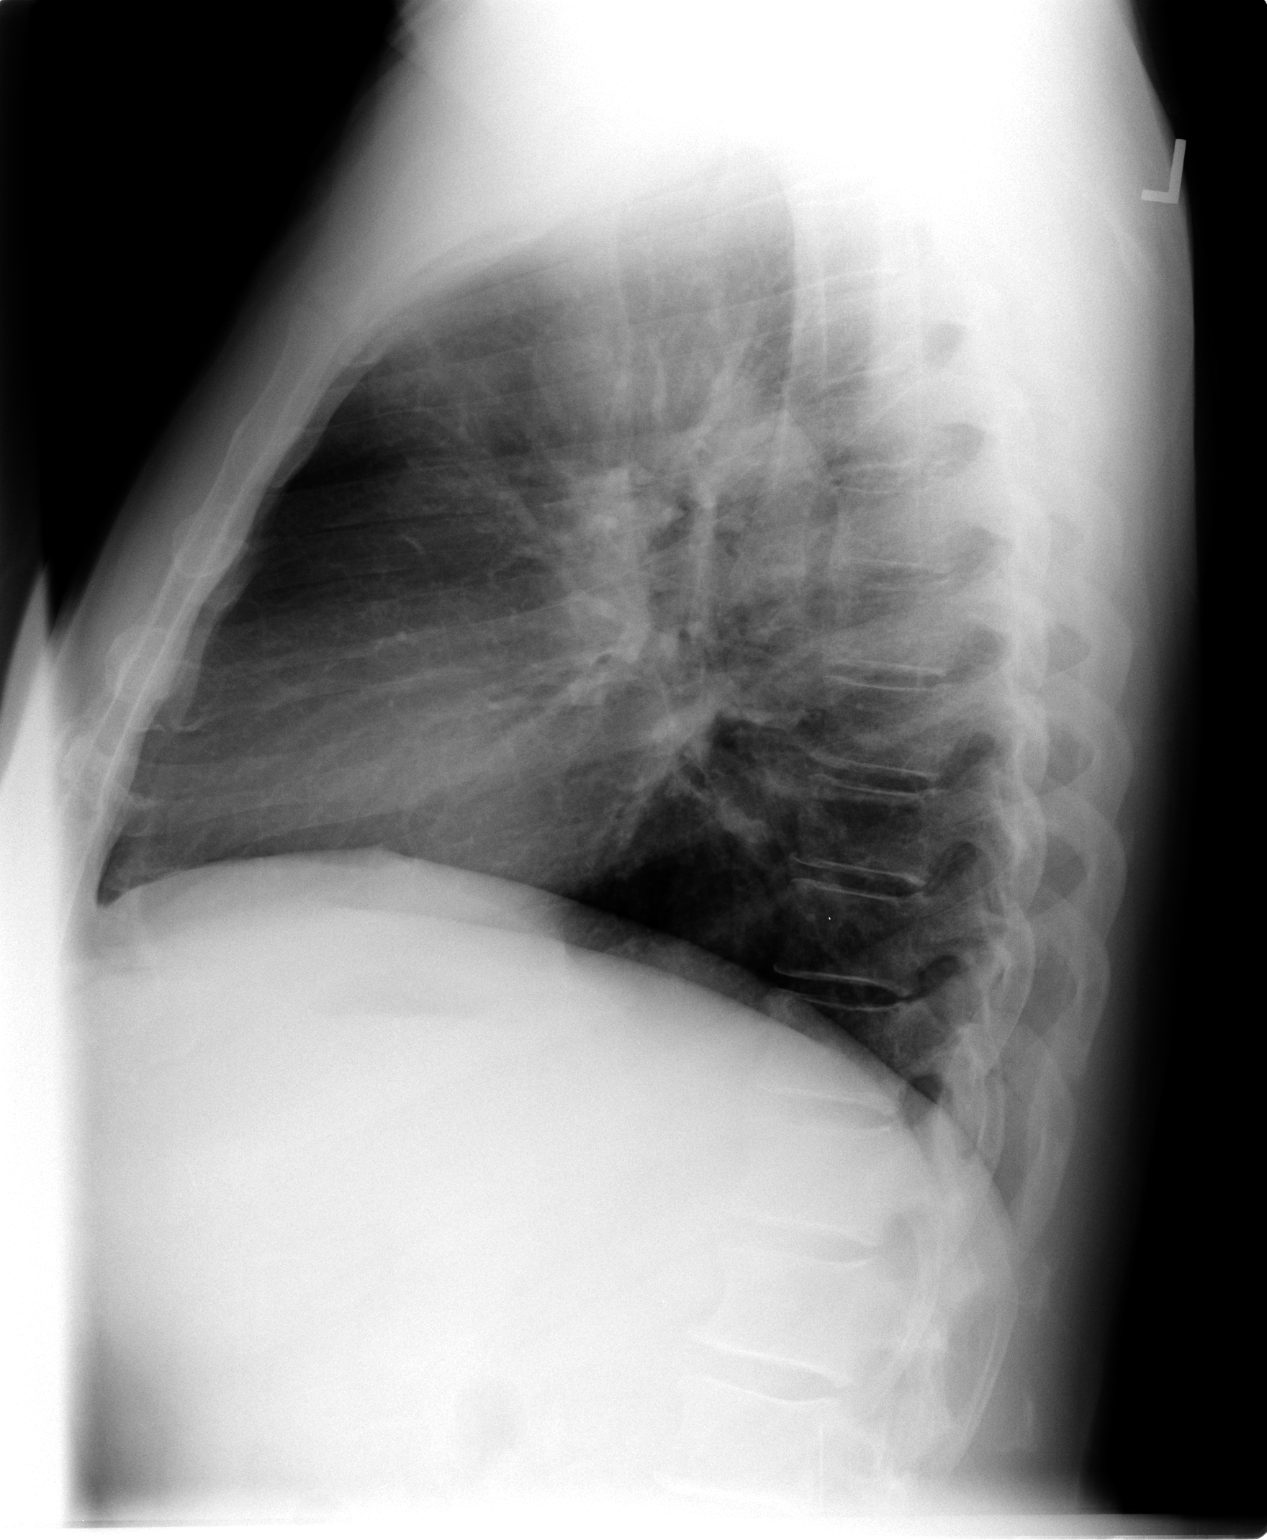

[2 of 2 positions shown; findings below may reference images not displayed]

FINDINGS: The lungs are well-aerated and clear. There is no evidence of focal
opacification, pleural effusion or pneumothorax.

The heart is normal in size; the mediastinal contour is within
normal limits. No acute osseous abnormalities are seen.
IMPRESSION: No acute cardiopulmonary process seen.

## 2014-12-29 DIAGNOSIS — E119 Type 2 diabetes mellitus without complications: Secondary | ICD-10-CM | POA: Diagnosis not present

## 2014-12-29 DIAGNOSIS — Z79899 Other long term (current) drug therapy: Secondary | ICD-10-CM | POA: Diagnosis not present

## 2014-12-29 DIAGNOSIS — M7581 Other shoulder lesions, right shoulder: Secondary | ICD-10-CM | POA: Diagnosis not present

## 2014-12-29 DIAGNOSIS — Z87891 Personal history of nicotine dependence: Secondary | ICD-10-CM | POA: Diagnosis not present

## 2014-12-29 DIAGNOSIS — M1990 Unspecified osteoarthritis, unspecified site: Secondary | ICD-10-CM | POA: Diagnosis not present

## 2014-12-29 DIAGNOSIS — M25511 Pain in right shoulder: Secondary | ICD-10-CM | POA: Diagnosis not present

## 2014-12-29 DIAGNOSIS — M779 Enthesopathy, unspecified: Secondary | ICD-10-CM | POA: Diagnosis not present

## 2015-01-02 DIAGNOSIS — E118 Type 2 diabetes mellitus with unspecified complications: Secondary | ICD-10-CM | POA: Diagnosis not present

## 2015-01-02 DIAGNOSIS — M25511 Pain in right shoulder: Secondary | ICD-10-CM | POA: Diagnosis not present

## 2015-02-14 DIAGNOSIS — R7989 Other specified abnormal findings of blood chemistry: Secondary | ICD-10-CM | POA: Diagnosis not present

## 2015-02-14 DIAGNOSIS — R1310 Dysphagia, unspecified: Secondary | ICD-10-CM | POA: Diagnosis not present

## 2015-02-14 DIAGNOSIS — R079 Chest pain, unspecified: Secondary | ICD-10-CM | POA: Diagnosis not present

## 2015-02-14 DIAGNOSIS — E119 Type 2 diabetes mellitus without complications: Secondary | ICD-10-CM | POA: Diagnosis not present

## 2015-02-14 DIAGNOSIS — R0789 Other chest pain: Secondary | ICD-10-CM | POA: Diagnosis not present

## 2015-02-14 DIAGNOSIS — R9431 Abnormal electrocardiogram [ECG] [EKG]: Secondary | ICD-10-CM | POA: Diagnosis not present

## 2015-02-14 DIAGNOSIS — F449 Dissociative and conversion disorder, unspecified: Secondary | ICD-10-CM | POA: Diagnosis not present

## 2015-02-14 DIAGNOSIS — K222 Esophageal obstruction: Secondary | ICD-10-CM | POA: Diagnosis not present

## 2015-02-15 DIAGNOSIS — R1310 Dysphagia, unspecified: Secondary | ICD-10-CM | POA: Diagnosis not present

## 2015-02-15 DIAGNOSIS — K219 Gastro-esophageal reflux disease without esophagitis: Secondary | ICD-10-CM | POA: Diagnosis not present

## 2015-02-16 DIAGNOSIS — E119 Type 2 diabetes mellitus without complications: Secondary | ICD-10-CM | POA: Diagnosis not present

## 2015-02-16 DIAGNOSIS — R1310 Dysphagia, unspecified: Secondary | ICD-10-CM | POA: Diagnosis not present

## 2015-02-16 DIAGNOSIS — K219 Gastro-esophageal reflux disease without esophagitis: Secondary | ICD-10-CM | POA: Diagnosis not present

## 2015-02-16 DIAGNOSIS — K222 Esophageal obstruction: Secondary | ICD-10-CM | POA: Diagnosis not present

## 2015-03-16 DIAGNOSIS — K222 Esophageal obstruction: Secondary | ICD-10-CM | POA: Diagnosis not present

## 2015-03-16 DIAGNOSIS — K219 Gastro-esophageal reflux disease without esophagitis: Secondary | ICD-10-CM | POA: Diagnosis not present

## 2015-04-17 DIAGNOSIS — E118 Type 2 diabetes mellitus with unspecified complications: Secondary | ICD-10-CM | POA: Diagnosis not present

## 2015-04-17 DIAGNOSIS — M25511 Pain in right shoulder: Secondary | ICD-10-CM | POA: Diagnosis not present

## 2015-07-02 ENCOUNTER — Encounter: Payer: Self-pay | Admitting: Orthopedic Surgery

## 2015-07-22 DIAGNOSIS — R079 Chest pain, unspecified: Secondary | ICD-10-CM | POA: Diagnosis not present

## 2015-07-22 DIAGNOSIS — R05 Cough: Secondary | ICD-10-CM | POA: Diagnosis not present

## 2016-03-08 ENCOUNTER — Emergency Department (HOSPITAL_COMMUNITY)
Admission: EM | Admit: 2016-03-08 | Discharge: 2016-03-08 | Discharge status: Absent without leave | Payer: Medicare Other

## 2016-07-18 ENCOUNTER — Emergency Department (HOSPITAL_COMMUNITY)
Admission: EM | Admit: 2016-07-18 | Discharge: 2016-07-18 | Disposition: A | Discharge status: Home / Self Care | Payer: Medicaid Other | Attending: Emergency Medicine | Admitting: Emergency Medicine

## 2016-07-18 ENCOUNTER — Encounter (HOSPITAL_COMMUNITY): Payer: Self-pay | Admitting: *Deleted

## 2016-07-18 DIAGNOSIS — Z79899 Other long term (current) drug therapy: Secondary | ICD-10-CM | POA: Diagnosis not present

## 2016-07-18 DIAGNOSIS — E119 Type 2 diabetes mellitus without complications: Secondary | ICD-10-CM | POA: Insufficient documentation

## 2016-07-18 DIAGNOSIS — Z7982 Long term (current) use of aspirin: Secondary | ICD-10-CM | POA: Insufficient documentation

## 2016-07-18 DIAGNOSIS — M79605 Pain in left leg: Secondary | ICD-10-CM | POA: Diagnosis present

## 2016-07-18 DIAGNOSIS — Z87891 Personal history of nicotine dependence: Secondary | ICD-10-CM | POA: Insufficient documentation

## 2016-07-18 DIAGNOSIS — I10 Essential (primary) hypertension: Secondary | ICD-10-CM | POA: Insufficient documentation

## 2016-07-18 DIAGNOSIS — M25562 Pain in left knee: Secondary | ICD-10-CM | POA: Diagnosis not present

## 2016-07-18 DIAGNOSIS — Z7984 Long term (current) use of oral hypoglycemic drugs: Secondary | ICD-10-CM | POA: Insufficient documentation

## 2016-07-18 MED ORDER — IBUPROFEN 400 MG PO TABS
600.0000 mg | ORAL_TABLET | Freq: Once | ORAL | Status: DC
  Filled 2016-07-18: qty 2

## 2016-07-18 MED ORDER — MELOXICAM 7.5 MG PO TABS: 7.5000 mg | ORAL_TABLET | Freq: Once | ORAL | Status: DC

## 2016-07-18 MED ORDER — MELOXICAM 7.5 MG PO TABS: 7.5000 mg | ORAL_TABLET | Freq: Every day | ORAL | 0 refills | Status: DC

## 2016-07-18 MED ORDER — MELOXICAM 7.5 MG PO TABS
7.5000 mg | ORAL_TABLET | Freq: Once | ORAL | Status: DC
  Filled 2016-07-18: qty 1

## 2016-07-18 MED ORDER — MELOXICAM 7.5 MG PO TABS
7.5000 mg | ORAL_TABLET | Freq: Once | ORAL | Status: AC
  Administered 2016-07-18: 7.5 mg via ORAL
  Filled 2016-07-18: qty 1

## 2016-07-18 NOTE — ED Triage Notes (Signed)
Pt comes in with a shooting pain to his left leg, slightly above his left knee. Pt denies any injury. Pain worsens with movement. NAD noted. Pt is ambulatory.

## 2016-07-18 NOTE — ED Provider Notes (Signed)
[facility] DEPT Provider Note   CSN: [id number] Arrival date & time: 07/18/16  1443  First Provider Contact:  First MD Initiated Contact with Patient 07/18/16 1516        History   Chief Complaint Chief Complaint  Patient presents with  . Leg Pain    HPI Joshua Kemp is a 60 y.o. male.  Joshua Kemp is a 60 y.o. Male with known degenerative changes in his knees, most recently had steroid injection in the left knee several years ago which has been fairly well controlled, presenting today with pain in his right medial lower thigh above the knee described as an intermittent pinching sensation which is worsened with standing.  He reports he spent several hours kneeling yesterday replacing linoleum flooring after which time he felt this pain.  He denies specifically any injury to the knee or leg. There is no radiation of pain which is fleeting and intermittent.  He has found no alleviators.     The history is provided by the patient.  Leg Pain   Pertinent negatives include no numbness.    Past Medical History:  Diagnosis Date  . Diabetes mellitus   . H/O chest wall injury   . Hiatal hernia   . Hypertension     Patient Active Problem List   Diagnosis Date Noted  . Radicular leg pain 06/16/2014  . Effusion of knee joint 06/16/2014  . Primary osteoarthritis of left knee 06/16/2014  . Arthritis of knee, degenerative 01/21/2014  . Precordial pain 12/20/2012  . EKG abnormalities 12/20/2012  . PURE HYPERCHOLESTEROLEMIA 10/19/2009  . NONDEPENDENT TOBACCO USE DISORDER 10/19/2009  . ESOPHAGEAL REFLUX 10/19/2009  . CHEST PAIN UNSPECIFIED 10/19/2009  . PAINFUL RESPIRATION 10/19/2009    Past Surgical History:  Procedure Laterality Date  . APPENDECTOMY         Home Medications    Prior to Admission medications   Medication Sig Start Date End Date Taking? Authorizing Provider  aspirin EC 81 MG tablet Take 81 mg by mouth every other day.    Historical Provider,  MD  famotidine (PEPCID) 20 MG tablet Take 20 mg by mouth daily.    Historical Provider, MD  gabapentin (NEURONTIN) 400 MG capsule Take 400 mg by mouth at bedtime as needed (pain).     Historical Provider, MD  meloxicam (MOBIC) 7.5 MG tablet Take 1-2 tablets (7.5-15 mg total) by mouth daily. 07/18/16   [name], PA-C  metFORMIN (GLUCOPHAGE) 1000 MG tablet Take 1,000 mg by mouth 2 (two) times daily.    Historical Provider, MD  OMEPRAZOLE PO Take 1 capsule by mouth daily.    Historical Provider, MD  promethazine-codeine (PHENERGAN WITH CODEINE) 6.25-10 MG/5ML syrup Take 5 mLs by mouth every 6 (six) hours as needed. 09/26/14   Lily Kocher, PA-C    Family History No family history on file.  Social History Social History  Substance Use Topics  . Smoking status: Former Research scientist (life sciences)  . Smokeless tobacco: Never Used  . Alcohol use No     Allergies   Review of patient's allergies indicates no known allergies.   Review of Systems Review of Systems  Constitutional: Negative for fever.  Musculoskeletal: Positive for arthralgias. Negative for joint swelling and myalgias.  Skin: Negative for color change and wound.  Neurological: Negative for weakness and numbness.     Physical Exam Updated Vital Signs BP 159/61 (BP Location: Left Arm)   Pulse 73   Temp 99 F (37.2 C) (Oral)  Resp 16   Ht 6\' 4"  (1.93 m)   Wt 104.3 kg   SpO2 97%   BMI 28.00 kg/m   Physical Exam  Constitutional: He appears well-developed and well-nourished.  HENT:  Head: Atraumatic.  Neck: Normal range of motion.  Cardiovascular:  Pulses equal bilaterally  Musculoskeletal: He exhibits tenderness.       Left knee: He exhibits normal range of motion, no swelling, no effusion, no deformity, no erythema, no LCL laxity and no MCL laxity. Tenderness found. Medial joint line tenderness noted.  ttp with reproduction and radiation of pinching pain to the left distal medial thigh with direct palpation of the left knee  medial joint space.  No foot or ankle edema.  Calf soft, nontender.  Neurological: He is alert. He has normal strength. He displays normal reflexes. No sensory deficit.  Skin: Skin is warm and dry. No rash noted.  Psychiatric: He has a normal mood and affect.     ED Treatments / Results  Labs (all labs ordered are listed, but only abnormal results are displayed) Labs Reviewed - No data to display  EKG  EKG Interpretation None       Radiology No results found.  Procedures Procedures (including critical care time)  Medications Ordered in ED Medications - No data to display   Initial Impression / Assessment and Plan / ED Course  I have reviewed the triage vital signs and the nursing notes.  Pertinent labs & imaging results that were available during my care of the patient were reviewed by me and considered in my medical decision making (see chart for details).  Clinical Course    Pt with left knee pain after kneeling ytd.  Reproducible.  Suspect soft tissue edema/ possible deep contusion from ytd activities.  No erythema, no pain with joint flex/ext. Doubt septic joint. No hx trauma.  Ice x 2 days, heat on day 3.  Meloxicam, avoid stressing the knee with kneeling. F/u with pcp if sx persist/worsen.    Final Clinical Impressions(s) / ED Diagnoses   Final diagnoses:  Knee pain, acute, left    New Prescriptions New Prescriptions   MELOXICAM (MOBIC) 7.5 MG TABLET    Take 1-2 tablets (7.5-15 mg total) by mouth daily.     [name], PA-C 07/18/16 [name], MD 07/19/16 [phone]

## 2017-09-26 ENCOUNTER — Ambulatory Visit (HOSPITAL_COMMUNITY)
Admission: RE | Admit: 2017-09-26 | Discharge: 2017-09-26 | Disposition: A | Discharge status: Home / Self Care | Payer: Medicaid Other | Source: Ambulatory Visit | Attending: Family Medicine | Admitting: Family Medicine

## 2017-09-26 ENCOUNTER — Other Ambulatory Visit (HOSPITAL_COMMUNITY): Payer: Self-pay | Admitting: Family Medicine

## 2017-09-26 DIAGNOSIS — R52 Pain, unspecified: Secondary | ICD-10-CM

## 2017-09-26 DIAGNOSIS — M79646 Pain in unspecified finger(s): Secondary | ICD-10-CM | POA: Insufficient documentation

## 2017-09-26 DIAGNOSIS — M79643 Pain in unspecified hand: Secondary | ICD-10-CM | POA: Diagnosis present

## 2018-03-11 ENCOUNTER — Other Ambulatory Visit (HOSPITAL_COMMUNITY): Payer: Self-pay | Admitting: Family Medicine

## 2018-03-11 DIAGNOSIS — R142 Eructation: Principal | ICD-10-CM

## 2018-03-11 DIAGNOSIS — R141 Gas pain: Secondary | ICD-10-CM

## 2018-03-11 DIAGNOSIS — R143 Flatulence: Principal | ICD-10-CM

## 2018-03-15 ENCOUNTER — Ambulatory Visit (HOSPITAL_COMMUNITY)
Admission: RE | Admit: 2018-03-15 | Discharge: 2018-03-15 | Disposition: A | Discharge status: Home / Self Care | Payer: Medicaid Other | Source: Ambulatory Visit | Attending: Family Medicine | Admitting: Family Medicine

## 2018-03-15 DIAGNOSIS — R143 Flatulence: Secondary | ICD-10-CM | POA: Diagnosis not present

## 2018-03-15 DIAGNOSIS — R932 Abnormal findings on diagnostic imaging of liver and biliary tract: Secondary | ICD-10-CM | POA: Diagnosis not present

## 2018-03-15 DIAGNOSIS — R142 Eructation: Secondary | ICD-10-CM

## 2018-03-15 DIAGNOSIS — R141 Gas pain: Secondary | ICD-10-CM

## 2018-09-06 ENCOUNTER — Emergency Department (HOSPITAL_COMMUNITY)
Admission: EM | Admit: 2018-09-06 | Discharge: 2018-09-07 | Disposition: A | Discharge status: Home / Self Care | Payer: Medicaid Other | Attending: Emergency Medicine | Admitting: Emergency Medicine

## 2018-09-06 ENCOUNTER — Other Ambulatory Visit: Payer: Self-pay

## 2018-09-06 DIAGNOSIS — Z7982 Long term (current) use of aspirin: Secondary | ICD-10-CM | POA: Diagnosis not present

## 2018-09-06 DIAGNOSIS — Y9389 Activity, other specified: Secondary | ICD-10-CM | POA: Insufficient documentation

## 2018-09-06 DIAGNOSIS — M545 Low back pain, unspecified: Secondary | ICD-10-CM

## 2018-09-06 DIAGNOSIS — S3992XA Unspecified injury of lower back, initial encounter: Secondary | ICD-10-CM | POA: Diagnosis present

## 2018-09-06 DIAGNOSIS — Y999 Unspecified external cause status: Secondary | ICD-10-CM | POA: Diagnosis not present

## 2018-09-06 DIAGNOSIS — Y92524 Gas station as the place of occurrence of the external cause: Secondary | ICD-10-CM | POA: Diagnosis not present

## 2018-09-06 DIAGNOSIS — Z7984 Long term (current) use of oral hypoglycemic drugs: Secondary | ICD-10-CM | POA: Insufficient documentation

## 2018-09-06 DIAGNOSIS — I10 Essential (primary) hypertension: Secondary | ICD-10-CM | POA: Insufficient documentation

## 2018-09-06 DIAGNOSIS — E119 Type 2 diabetes mellitus without complications: Secondary | ICD-10-CM | POA: Insufficient documentation

## 2018-09-06 DIAGNOSIS — Z79899 Other long term (current) drug therapy: Secondary | ICD-10-CM | POA: Insufficient documentation

## 2018-09-06 DIAGNOSIS — Z87891 Personal history of nicotine dependence: Secondary | ICD-10-CM | POA: Diagnosis not present
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
## 2018-09-07 NOTE — Discharge Instructions (Addendum)
Your physical exam looks good today!  As we discussed, you will be more sore tomorrow and for the next few days. You may use Tylenol and/or Ibuprofen for relief. You may also try warm and cold compresses for additional relief.  Be sure to rest and take it easy this weekend!

## 2018-12-17 ENCOUNTER — Emergency Department (HOSPITAL_COMMUNITY): Payer: Medicaid Other

## 2018-12-17 ENCOUNTER — Encounter (HOSPITAL_COMMUNITY): Payer: Self-pay

## 2018-12-17 ENCOUNTER — Other Ambulatory Visit: Payer: Self-pay

## 2018-12-17 ENCOUNTER — Emergency Department (HOSPITAL_COMMUNITY)
Admission: EM | Admit: 2018-12-17 | Discharge: 2018-12-17 | Disposition: A | Discharge status: Home / Self Care | Payer: Medicaid Other | Attending: Emergency Medicine | Admitting: Emergency Medicine

## 2018-12-17 DIAGNOSIS — R739 Hyperglycemia, unspecified: Secondary | ICD-10-CM

## 2018-12-17 DIAGNOSIS — Z7984 Long term (current) use of oral hypoglycemic drugs: Secondary | ICD-10-CM | POA: Insufficient documentation

## 2018-12-17 DIAGNOSIS — Z79899 Other long term (current) drug therapy: Secondary | ICD-10-CM | POA: Insufficient documentation

## 2018-12-17 DIAGNOSIS — I10 Essential (primary) hypertension: Secondary | ICD-10-CM | POA: Diagnosis not present

## 2018-12-17 DIAGNOSIS — R350 Frequency of micturition: Secondary | ICD-10-CM | POA: Diagnosis present

## 2018-12-17 DIAGNOSIS — Z87891 Personal history of nicotine dependence: Secondary | ICD-10-CM | POA: Insufficient documentation

## 2018-12-17 DIAGNOSIS — Z7982 Long term (current) use of aspirin: Secondary | ICD-10-CM | POA: Insufficient documentation

## 2018-12-17 DIAGNOSIS — E1165 Type 2 diabetes mellitus with hyperglycemia: Secondary | ICD-10-CM | POA: Diagnosis not present

## 2018-12-17 LAB — CBC WITH DIFFERENTIAL/PLATELET
Abs Immature Granulocytes: 0.01 10*3/uL (ref 0.00–0.07)
BASOS ABS: 0 10*3/uL (ref 0.0–0.1)
BASOS PCT: 0 %
EOS ABS: 0.1 10*3/uL (ref 0.0–0.5)
Eosinophils Relative: 2 %
HCT: 46.3 % (ref 39.0–52.0)
Hemoglobin: 15.8 g/dL (ref 13.0–17.0)
Immature Granulocytes: 0 %
Lymphocytes Relative: 30 %
Lymphs Abs: 1.4 10*3/uL (ref 0.7–4.0)
MCH: 28.8 pg (ref 26.0–34.0)
MCHC: 34.1 g/dL (ref 30.0–36.0)
MCV: 84.3 fL (ref 80.0–100.0)
Monocytes Absolute: 0.4 10*3/uL (ref 0.1–1.0)
Monocytes Relative: 8 %
NEUTROS PCT: 60 %
NRBC: 0 % (ref 0.0–0.2)
Neutro Abs: 2.7 10*3/uL (ref 1.7–7.7)
PLATELETS: 203 10*3/uL (ref 150–400)
RBC: 5.49 MIL/uL (ref 4.22–5.81)
RDW: 11.3 % — ABNORMAL LOW (ref 11.5–15.5)
WBC: 4.6 10*3/uL (ref 4.0–10.5)

## 2018-12-17 LAB — URINALYSIS, ROUTINE W REFLEX MICROSCOPIC
Bilirubin Urine: NEGATIVE
Glucose, UA: >=500 mg/dL — AB
Hgb urine dipstick: NEGATIVE
Ketones, ur: 5 mg/dL — AB
LEUKOCYTES UA: NEGATIVE
Nitrite: NEGATIVE
Protein, ur: NEGATIVE mg/dL
Specific Gravity, Urine: 1.028 (ref 1.005–1.030)
pH: 6 (ref 5.0–8.0)

## 2018-12-17 LAB — BASIC METABOLIC PANEL
Anion gap: 10 (ref 5–15)
BUN: 18 mg/dL (ref 8–23)
CALCIUM: 9.1 mg/dL (ref 8.9–10.3)
CO2: 23 mmol/L (ref 22–32)
Chloride: 97 mmol/L — ABNORMAL LOW (ref 98–111)
Creatinine, Ser: 0.93 mg/dL (ref 0.61–1.24)
GFR calc Af Amer: >60 mL/min (ref >60)
Glucose, Bld: 464 mg/dL — ABNORMAL HIGH (ref 70–99)
Potassium: 4 mmol/L (ref 3.5–5.1)
SODIUM: 130 mmol/L — AB (ref 135–145)

## 2018-12-17 LAB — CBG MONITORING, ED
GLUCOSE-CAPILLARY: 483 mg/dL — AB (ref 70–99)
Glucose-Capillary: 384 mg/dL — ABNORMAL HIGH (ref 70–99)

## 2018-12-17 MED ORDER — SODIUM CHLORIDE 0.9 % IV BOLUS
1000.0000 mL | Freq: Once | INTRAVENOUS | Status: AC
  Administered 2018-12-17: 1000 mL via INTRAVENOUS

## 2018-12-17 MED ORDER — INSULIN ASPART 100 UNIT/ML ~~LOC~~ SOLN
10.0000 [IU] | Freq: Once | SUBCUTANEOUS | Status: AC
  Administered 2018-12-17: 10 [IU] via SUBCUTANEOUS

## 2018-12-17 NOTE — Discharge Instructions (Signed)
Please pick up a new glucose monitor.  Please monitor your blood sugars at home.  I would like you to follow-up with your primary care provider this week for follow-up in regards today's visit.  I have included a handout on diabetic nutrition.  Please see attached handouts. If you develop worsening or new concerning symptoms you can return to the emergency department for re-evaluation.

## 2018-12-17 NOTE — ED Provider Notes (Signed)
[facility] EMERGENCY DEPARTMENT Provider Note   CSN: [id number] Arrival date & time: 12/17/18  3893     History   Chief Complaint Chief Complaint  Patient presents with  . Urinary Frequency    HPI Joshua Kemp is a 62 y.o. male with a history of non-insulin-dependent diabetes, hypertension who presents emergency department today for urinary frequency.  Patient reports that over the last 3 weeks he has been having increasing urinary frequency every 2 hours.  He reports that this normally happens when his sugars are high however his CBG monitor has been stating that he is usually between 150 and 200.  Patient reports that as it is the holidays and his sugars have been running normal he has been splurging on cookies, cakes etc.  Prior to me entering the room the patient's blood sugars were in the 400s.  He is his monitor prior to me entering the room and states that his is 151.  He believes his blood sugar monitor may be broken and thinks his urinary frequency may be due to his sugars running high. He also admits to polydipsia polyphagia along with his polyuria. he denies any associated flank pain, abdominal pain, nausea, vomiting, dysuria, hematuria, painful bowel movements, penile discharge.  Patient reports that over the last 2-3 days he has also had a dry cough.  He denies any fever, chills, neck stiffness, sinus congestion, sore throat, shortness of breath, chest pain, hemoptysis or lower extremity swelling.  He has not been taking anything for symptoms.  Patient is on metformin 1000 mg twice daily for his diabetes.  HPI  Past Medical History:  Diagnosis Date  . Diabetes mellitus   . H/O chest wall injury   . Hiatal hernia   . Hypertension     Patient Active Problem List   Diagnosis Date Noted  . Radicular leg pain 06/16/2014  . Effusion of knee joint 06/16/2014  . Primary osteoarthritis of left knee 06/16/2014  . Arthritis of knee, degenerative 01/21/2014  . Precordial  pain 12/20/2012  . EKG abnormalities 12/20/2012  . PURE HYPERCHOLESTEROLEMIA 10/19/2009  . NONDEPENDENT TOBACCO USE DISORDER 10/19/2009  . ESOPHAGEAL REFLUX 10/19/2009  . CHEST PAIN UNSPECIFIED 10/19/2009  . PAINFUL RESPIRATION 10/19/2009    Past Surgical History:  Procedure Laterality Date  . APPENDECTOMY          Home Medications    Prior to Admission medications   Medication Sig Start Date End Date Taking? Authorizing Provider  aspirin EC 81 MG tablet Take 81 mg by mouth every other day.    [provider]  famotidine (PEPCID) 20 MG tablet Take 20 mg by mouth daily.    [provider]  gabapentin (NEURONTIN) 400 MG capsule Take 400 mg by mouth at bedtime as needed (pain).     [provider]  meloxicam (MOBIC) 7.5 MG tablet Take 1-2 tablets (7.5-15 mg total) by mouth daily. 07/18/16   [name], PA-C  metFORMIN (GLUCOPHAGE) 1000 MG tablet Take 1,000 mg by mouth 2 (two) times daily.    [provider]  OMEPRAZOLE PO Take 1 capsule by mouth daily.    [provider]  promethazine-codeine (PHENERGAN WITH CODEINE) 6.25-10 MG/5ML syrup Take 5 mLs by mouth every 6 (six) hours as needed. 09/26/14   Lily Kocher, PA-C    Family History No family history on file.  Social History Social History   Tobacco Use  . Smoking status: Former Research scientist (life sciences)  . Smokeless tobacco: Never Used  Substance Use Topics  . Alcohol use: No  . Drug use: No     Allergies   Patient has no known allergies.   Review of Systems Review of Systems  Constitutional: Negative for diaphoresis and fever.  HENT: Negative.   Eyes: Negative for visual disturbance.  Respiratory: Positive for cough. Negative for chest tightness and shortness of breath.   Cardiovascular: Negative for chest pain.  Gastrointestinal: Negative for abdominal pain, diarrhea, nausea and vomiting.  Endocrine: Positive for polydipsia, polyphagia and polyuria.  Genitourinary: Positive for  frequency. Negative for difficulty urinating, discharge, dysuria, flank pain, hematuria, penile pain, penile swelling, testicular pain and urgency.  Musculoskeletal: Negative for neck stiffness.  Skin: Negative for rash.  Allergic/Immunologic: Positive for immunocompromised state.  Neurological: Negative for dizziness, syncope and weakness.  All other systems reviewed and are negative.    Physical Exam Updated Vital Signs BP (!) 150/86 (BP Location: Left Arm)   Pulse 82   Temp 97.7 F (36.5 C) (Oral)   Resp 12   Ht 6\' 4"  (1.93 m)   Wt 89.8 kg   SpO2 98%   BMI 24.10 kg/m   Physical Exam Vitals signs and nursing note reviewed.  Constitutional:      Appearance: He is well-developed. He is not diaphoretic.  HENT:     Head: Normocephalic and atraumatic.     Right Ear: External ear normal.     Left Ear: External ear normal.     Nose: Nose normal.     Mouth/Throat:     Mouth: Mucous membranes are moist.     Dentition: Normal dentition.     Pharynx: Oropharynx is clear. Uvula midline. No pharyngeal swelling, oropharyngeal exudate, posterior oropharyngeal erythema or uvula swelling.     Tonsils: No tonsillar exudate or tonsillar abscesses.  Eyes:     General: No scleral icterus.       Right eye: No discharge.        Left eye: No discharge.     Pupils: Pupils are equal, round, and reactive to light.  Neck:     Musculoskeletal: Neck supple. Normal range of motion. No neck rigidity or spinous process tenderness.     Trachea: Trachea normal.     Comments: No nuchal rigidity or meningismus Cardiovascular:     Rate and Rhythm: Normal rate and regular rhythm.     Pulses:          Radial pulses are 2+ on the right side and 2+ on the left side.       Dorsalis pedis pulses are 2+ on the right side and 2+ on the left side.       Posterior tibial pulses are 2+ on the right side and 2+ on the left side.     Heart sounds: No murmur.     Comments: No lower extremity swelling or edema.  Calves symmetric in size bilaterally. Pulmonary:     Effort: Pulmonary effort is normal.     Breath sounds: Normal breath sounds.     Comments: No increased work of breathing. No accessory muscle use. Patient is sitting upright, speaking in full sentences without difficulty  Chest:     Chest wall: No tenderness.  Abdominal:     General: Bowel sounds are normal. There is no distension.     Palpations: Abdomen is soft.     Tenderness: There is no abdominal tenderness. There is no right CVA tenderness, left CVA tenderness, guarding or rebound. Negative signs include Murphy's  sign and McBurney's sign.  Lymphadenopathy:     Cervical: No cervical adenopathy.  Skin:    General: Skin is warm and dry.     Findings: No rash.  Neurological:     Mental Status: He is alert.      ED Treatments / Results  Labs (all labs ordered are listed, but only abnormal results are displayed) Labs Reviewed  URINALYSIS, ROUTINE W REFLEX MICROSCOPIC - Abnormal; Notable for the following components:      Result Value   Color, Urine STRAW (*)    Glucose, UA >=500 (*)    Ketones, ur 5 (*)    Bacteria, UA RARE (*)    All other components within normal limits  BASIC METABOLIC PANEL - Abnormal; Notable for the following components:   Sodium 130 (*)    Chloride 97 (*)    Glucose, Bld 464 (*)    All other components within normal limits  CBC WITH DIFFERENTIAL/PLATELET - Abnormal; Notable for the following components:   RDW 11.3 (*)    All other components within normal limits  CBG MONITORING, ED - Abnormal; Notable for the following components:   Glucose-Capillary 483 (*)    All other components within normal limits  CBG MONITORING, ED - Abnormal; Notable for the following components:   Glucose-Capillary 384 (*)    All other components within normal limits    EKG None  Radiology No results found.  Procedures Procedures (including critical care time)  Medications Ordered in ED Medications    sodium chloride 0.9 % bolus 1,000 mL (0 mLs Intravenous Stopped 12/17/18 1249)  insulin aspart (novoLOG) injection 10 Units (10 Units Subcutaneous Given 12/17/18 1053)     Initial Impression / Assessment and Plan / ED Course  I have reviewed the triage vital signs and the nursing notes.  Pertinent labs & imaging results that were available during my care of the patient were reviewed by me and considered in my medical decision making (see chart for details).     62 y.o. male presenting for increased urinary frequency.  Patient denies any infectious symptoms.  Vital signs are reassuring on presentation.  Patient CBG is noted be elevated in the 400s here.  He states that his has been running in the 100s-200s at home.  He checked his blood sugar monitor here right after ours and his stated 151.  Patient then reports that as his sugars were running in normal limits he has been splurging with sweets, including pies, cakes and cookies as it is the holidays.  I suspect patient's urinary frequency is related to his hyperglycemia.  Patient's hyperglycemia was treated here in the department.  There is no evidence of DKA.  I do not suspect infectious symptoms.  Patient without fever, nausea, vomiting, abdominal pain, flank pain.  No painful bowel movements.  Outpatient follow-up with his primary care provider.  Have recommended that he recalibrated or pick up a new CBG monitor.  I discussed with him diabetic diet and also included attached handout.  Return precautions were discussed.  Case was reviewed and discussed with my attending, Dr. Rogene Houston who is in agreement with plan.  Final Clinical Impressions(s) / ED Diagnoses   Final diagnoses:  Hyperglycemia    ED Discharge Orders    None       Lorelle Gibbs 12/17/18 1616    Fredia Sorrow, MD 12/18/18 1626

## 2018-12-17 NOTE — ED Triage Notes (Signed)
Pt has been having urinary frequency for the last 3 weeks. States he is a diabetic and has been having increased thirst as well. Also complaining of slight nausea and cold symptoms. Has not checked his blood sugar today but states yesterday it was 201 in the morning then drank 2 bottles of water and it was 148.

## 2019-01-18 ENCOUNTER — Encounter (HOSPITAL_COMMUNITY): Payer: Self-pay | Admitting: Emergency Medicine

## 2019-01-18 ENCOUNTER — Emergency Department (HOSPITAL_COMMUNITY)
Admission: EM | Admit: 2019-01-18 | Discharge: 2019-01-18 | Disposition: A | Discharge status: Home / Self Care | Payer: Medicaid Other | Attending: Emergency Medicine | Admitting: Emergency Medicine

## 2019-01-18 ENCOUNTER — Other Ambulatory Visit: Payer: Self-pay

## 2019-01-18 ENCOUNTER — Emergency Department (HOSPITAL_COMMUNITY): Payer: Medicaid Other

## 2019-01-18 DIAGNOSIS — Z7984 Long term (current) use of oral hypoglycemic drugs: Secondary | ICD-10-CM | POA: Diagnosis not present

## 2019-01-18 DIAGNOSIS — J111 Influenza due to unidentified influenza virus with other respiratory manifestations: Secondary | ICD-10-CM | POA: Diagnosis not present

## 2019-01-18 DIAGNOSIS — E119 Type 2 diabetes mellitus without complications: Secondary | ICD-10-CM | POA: Diagnosis not present

## 2019-01-18 DIAGNOSIS — I10 Essential (primary) hypertension: Secondary | ICD-10-CM | POA: Insufficient documentation

## 2019-01-18 DIAGNOSIS — R05 Cough: Secondary | ICD-10-CM | POA: Diagnosis present

## 2019-01-18 DIAGNOSIS — R69 Illness, unspecified: Secondary | ICD-10-CM

## 2019-01-18 DIAGNOSIS — Z87891 Personal history of nicotine dependence: Secondary | ICD-10-CM | POA: Insufficient documentation

## 2019-01-18 DIAGNOSIS — Z79899 Other long term (current) drug therapy: Secondary | ICD-10-CM | POA: Diagnosis not present

## 2019-01-18 MED ORDER — OSELTAMIVIR PHOSPHATE 75 MG PO CAPS: 75.0000 mg | ORAL_CAPSULE | Freq: Two times a day (BID) | ORAL | 0 refills | Status: AC

## 2019-01-18 NOTE — ED Triage Notes (Signed)
Pt c/o productive cough, fever, and HA that began last night.

## 2019-01-18 NOTE — Discharge Instructions (Signed)
Return if any problems.

## 2019-01-18 NOTE — ED Provider Notes (Signed)
[facility] EMERGENCY DEPARTMENT Provider Note   CSN: [id number] Arrival date & time: 01/18/19  3536     History   Chief Complaint Chief Complaint  Patient presents with  . Cough    HPI BRIA PORTALES is a 63 y.o. male.  The history is provided by the patient. No language interpreter was used.  Cough  Cough characteristics:  Productive Sputum characteristics:  Nondescript Severity:  Moderate Onset quality:  Gradual Timing:  Constant Progression:  Worsening Smoker: no   Context: upper respiratory infection   Relieved by:  Nothing Worsened by:  Nothing Associated symptoms: no fever     Past Medical History:  Diagnosis Date  . Diabetes mellitus   . H/O chest wall injury   . Hiatal hernia   . Hypertension     Patient Active Problem List   Diagnosis Date Noted  . Radicular leg pain 06/16/2014  . Effusion of knee joint 06/16/2014  . Primary osteoarthritis of left knee 06/16/2014  . Arthritis of knee, degenerative 01/21/2014  . Precordial pain 12/20/2012  . EKG abnormalities 12/20/2012  . PURE HYPERCHOLESTEROLEMIA 10/19/2009  . NONDEPENDENT TOBACCO USE DISORDER 10/19/2009  . ESOPHAGEAL REFLUX 10/19/2009  . CHEST PAIN UNSPECIFIED 10/19/2009  . PAINFUL RESPIRATION 10/19/2009    Past Surgical History:  Procedure Laterality Date  . APPENDECTOMY          Home Medications    Prior to Admission medications   Medication Sig Start Date End Date Taking? Authorizing Provider  atorvastatin (LIPITOR) 20 MG tablet Take 20 mg by mouth at bedtime.    [provider]  famotidine (PEPCID) 20 MG tablet Take 20 mg by mouth daily.    [provider]  gabapentin (NEURONTIN) 400 MG capsule Take 300 mg by mouth at bedtime.     [provider]  loratadine (CLARITIN) 10 MG tablet Take 10 mg by mouth daily.    [provider]  meloxicam (MOBIC) 7.5 MG tablet Take 1-2 tablets (7.5-15 mg total) by mouth daily. Patient not taking: Reported on  12/17/2018 07/18/16   [name], PA-C  metFORMIN (GLUCOPHAGE) 1000 MG tablet Take 1,000 mg by mouth 2 (two) times daily.    [provider]  metoCLOPramide (REGLAN) 5 MG tablet Take 5 mg by mouth 4 (four) times daily. Take 1 tablet 30 minutes before each meal and at bedtime for digestion    [provider]  omeprazole (PRILOSEC) 20 MG capsule Take 2 capsules by mouth daily.     [provider]  oseltamivir (TAMIFLU) 75 MG capsule Take 1 capsule (75 mg total) by mouth 2 (two) times daily for 5 days. 01/18/19 01/23/19  Fransico Meadow, PA-C  tamsulosin (FLOMAX) 0.4 MG CAPS capsule Take 0.4 mg by mouth at bedtime.    [provider]    Family History No family history on file.  Social History Social History   Tobacco Use  . Smoking status: Former Research scientist (life sciences)  . Smokeless tobacco: Never Used  Substance Use Topics  . Alcohol use: No  . Drug use: No     Allergies   Patient has no known allergies.   Review of Systems Review of Systems  Constitutional: Negative for fever.  Respiratory: Positive for cough.   All other systems reviewed and are negative.    Physical Exam Updated Vital Signs BP 139/77 (BP Location: Right Arm)   Pulse 92   Temp 100 F (37.8 C) (Oral)   Resp 16   Ht 6'  3" (1.905 m)   Wt 94.3 kg   SpO2 98%   BMI 26.00 kg/m   Physical Exam Vitals signs and nursing note reviewed.  Constitutional:      Appearance: He is well-developed.  HENT:     Head: Normocephalic.     Right Ear: Tympanic membrane normal.     Left Ear: Tympanic membrane normal.     Mouth/Throat:     Mouth: Mucous membranes are moist.  Eyes:     Pupils: Pupils are equal, round, and reactive to light.  Neck:     Musculoskeletal: Normal range of motion.  Cardiovascular:     Rate and Rhythm: Normal rate.  Pulmonary:     Effort: Pulmonary effort is normal.  Abdominal:     General: Abdomen is flat. There is no distension.  Musculoskeletal: Normal range of  motion.  Skin:    General: Skin is warm.  Neurological:     Mental Status: He is alert and oriented to person, place, and time.  Psychiatric:        Mood and Affect: Mood normal.      ED Treatments / Results  Labs (all labs ordered are listed, but only abnormal results are displayed) Labs Reviewed - No data to display  EKG None  Radiology Dg Chest 2 View  Result Date: 01/18/2019 CLINICAL DATA:  Cough and fever since yesterday. EXAM: CHEST - 2 VIEW COMPARISON:  Chest x-ray dated December 17, 2018. FINDINGS: The heart size and mediastinal contours are within normal limits. Both lungs are clear. The visualized skeletal structures are unremarkable. IMPRESSION: No active cardiopulmonary disease. Electronically Signed   By: Titus Dubin M.D.   On: 01/18/2019 11:03    Procedures Procedures (including critical care time)  Medications Ordered in ED Medications - No data to display   Initial Impression / Assessment and Plan / ED Course  I have reviewed the triage vital signs and the nursing notes.  Pertinent labs & imaging results that were available during my care of the patient were reviewed by me and considered in my medical decision making (see chart for details).     MDM  Chest xray no acute.  I suspect influenza.   Pt given Rx for tamiflu  Final Clinical Impressions(s) / ED Diagnoses   Final diagnoses:  Influenza  Influenza-like illness    ED Discharge Orders         Ordered    oseltamivir (TAMIFLU) 75 MG capsule  2 times daily     01/18/19 1108        An After Visit Summary was printed and given to the patient.    Sidney Ace 01/18/19 1110    Noemi Chapel, MD 01/18/19 1536

## 2019-01-18 NOTE — ED Notes (Signed)
No flu shot  Cough since last night

## 2020-03-25 ENCOUNTER — Ambulatory Visit: Payer: Medicaid Other | Attending: Internal Medicine

## 2020-03-25 DIAGNOSIS — Z23 Encounter for immunization: Secondary | ICD-10-CM

## 2020-03-25 NOTE — Progress Notes (Signed)
   Covid-19 Vaccination Clinic  Name:  MARWAN KISSELL    MRN: [id number] DOB: 05/17/1956  03/25/2020  Mr. Celona was observed post Covid-19 immunization for 15 minutes without incident. He was provided with Vaccine Information Sheet and instruction to access the V-Safe system.   Mr. Tainter was instructed to call 911 with any severe reactions post vaccine: Marland Kitchen Difficulty breathing  . Swelling of face and throat  . A fast heartbeat  . A bad rash all over body  . Dizziness and weakness   Immunizations Administered    Name Date Dose VIS Date Route   Moderna COVID-19 Vaccine 03/25/2020  9:12 AM 0.5 mL 11/18/2019 Intramuscular   Manufacturer: Moderna   Lot: [id number]   [id number]

## 2020-04-27 ENCOUNTER — Ambulatory Visit: Payer: Medicaid Other | Attending: Internal Medicine

## 2020-04-27 DIAGNOSIS — Z23 Encounter for immunization: Secondary | ICD-10-CM

## 2020-04-27 NOTE — Progress Notes (Signed)
   Covid-19 Vaccination Clinic  Name:  Joshua Kemp    MRN: [id number] DOB: 1956-03-02  04/27/2020  [name] was observed post Covid-19 immunization for 15 minutes without incident. He was provided with Vaccine Information Sheet and instruction to access the V-Safe system.   [name] was instructed to call 911 with any severe reactions post vaccine: Marland Kitchen Difficulty breathing  . Swelling of face and throat  . A fast heartbeat  . A bad rash all over body  . Dizziness and weakness   Immunizations Administered    Name Date Dose VIS Date Route   Moderna COVID-19 Vaccine 04/27/2020 10:14 AM 0.5 mL 11/2019 Intramuscular   Manufacturer: Moderna   Lot: [id number]   [place][id number]

## 2020-06-08 DIAGNOSIS — E781 Pure hyperglyceridemia: Secondary | ICD-10-CM | POA: Diagnosis not present

## 2020-06-08 DIAGNOSIS — E1169 Type 2 diabetes mellitus with other specified complication: Secondary | ICD-10-CM | POA: Diagnosis not present

## 2020-08-26 DIAGNOSIS — H2513 Age-related nuclear cataract, bilateral: Secondary | ICD-10-CM | POA: Diagnosis not present

## 2020-09-28 DIAGNOSIS — D100 Benign neoplasm of lip: Secondary | ICD-10-CM | POA: Diagnosis not present

## 2020-09-28 DIAGNOSIS — D1039 Benign neoplasm of other parts of mouth: Secondary | ICD-10-CM | POA: Diagnosis not present

## 2020-10-11 DIAGNOSIS — K4090 Unilateral inguinal hernia, without obstruction or gangrene, not specified as recurrent: Secondary | ICD-10-CM | POA: Diagnosis not present

## 2020-10-11 DIAGNOSIS — Z23 Encounter for immunization: Secondary | ICD-10-CM | POA: Diagnosis not present

## 2020-10-11 DIAGNOSIS — E781 Pure hyperglyceridemia: Secondary | ICD-10-CM | POA: Diagnosis not present

## 2020-10-11 DIAGNOSIS — E1169 Type 2 diabetes mellitus with other specified complication: Secondary | ICD-10-CM | POA: Diagnosis not present

## 2020-12-02 DIAGNOSIS — L600 Ingrowing nail: Secondary | ICD-10-CM | POA: Diagnosis not present

## 2020-12-02 DIAGNOSIS — E1140 Type 2 diabetes mellitus with diabetic neuropathy, unspecified: Secondary | ICD-10-CM | POA: Diagnosis not present

## 2020-12-02 DIAGNOSIS — M79671 Pain in right foot: Secondary | ICD-10-CM | POA: Diagnosis not present

## 2020-12-02 DIAGNOSIS — L110 Acquired keratosis follicularis: Secondary | ICD-10-CM | POA: Diagnosis not present

## 2020-12-02 DIAGNOSIS — I739 Peripheral vascular disease, unspecified: Secondary | ICD-10-CM | POA: Diagnosis not present

## 2020-12-30 ENCOUNTER — Other Ambulatory Visit: Payer: Self-pay

## 2020-12-30 ENCOUNTER — Ambulatory Visit (INDEPENDENT_AMBULATORY_CARE_PROVIDER_SITE_OTHER): Payer: Medicare Other | Admitting: General Surgery

## 2020-12-30 ENCOUNTER — Encounter: Payer: Self-pay | Admitting: General Surgery

## 2020-12-30 VITALS — BP 143/79 | HR 78 | Temp 96.8°F | Resp 16 | Ht 75.0 in | Wt 209.0 lb

## 2020-12-30 DIAGNOSIS — K4090 Unilateral inguinal hernia, without obstruction or gangrene, not specified as recurrent: Secondary | ICD-10-CM

## 2020-12-30 NOTE — Patient Instructions (Signed)
Inguinal Hernia, Adult An inguinal hernia develops when fat or the intestines push through a weak spot in a muscle where the leg meets the lower abdomen (groin). This creates a bulge. This kind of hernia could also be:  In the scrotum, if you are male.  In folds of skin around the vagina, if you are male. There are three types of inguinal hernias:  Hernias that can be pushed back into the abdomen (are reducible). This type rarely causes pain.  Hernias that are not reducible (are incarcerated).  Hernias that are not reducible and lose their blood supply (are strangulated). This type of hernia requires emergency surgery. What are the causes? This condition is caused by having a weak spot in the muscles or tissues in your groin. This develops over time. The hernia may poke through the weak spot when you suddenly strain your lower abdominal muscles, such as when you:  Lift a heavy object.  Strain to have a bowel movement. Constipation can lead to straining.  Cough. What increases the risk? This condition is more likely to develop in:  Males.  Pregnant females.  People who: ? Are overweight. ? Work in jobs that require long periods of standing or heavy lifting. ? Have had an inguinal hernia before. ? Smoke or have lung disease. These factors can lead to long-term (chronic) coughing. What are the signs or symptoms? Symptoms may depend on the size of the hernia. Often, a small inguinal hernia has no symptoms. Symptoms of a larger hernia may include:  A bulge in the groin area. This is easier to see when standing. It might not be visible when lying down.  Pain or burning in the groin. This may get worse when lifting, straining, or coughing.  A dull ache or a feeling of pressure in the groin.  An unusual bulge in the scrotum, in males. Symptoms of a strangulated inguinal hernia may include:  A bulge in your groin that is very painful and tender to the touch.  A bulge that  turns red or purple.  Fever, nausea, and vomiting.  Inability to have a bowel movement or to pass gas. How is this diagnosed? This condition is diagnosed based on your symptoms, your medical history, and a physical exam. Your health care provider may feel your groin area and ask you to cough. How is this treated? Treatment depends on the size of your hernia and whether you have symptoms. If you do not have symptoms, your health care provider may have you watch your hernia carefully and have you come in for follow-up visits. If your hernia is large or if you have symptoms, you may need surgery to repair the hernia. Follow these instructions at home: Lifestyle  Avoid lifting heavy objects.  Avoid standing for long periods of time.  Do not use any products that contain nicotine or tobacco. These products include cigarettes, chewing tobacco, and vaping devices, such as e-cigarettes. If you need help quitting, ask your health care provider.  Maintain a healthy weight. Preventing constipation You may need to take these actions to prevent or treat constipation:  Drink enough fluid to keep your urine pale yellow.  Take over-the-counter or prescription medicines.  Eat foods that are high in fiber, such as beans, whole grains, and fresh fruits and vegetables.  Limit foods that are high in fat and processed sugars, such as fried or sweet foods. General instructions  You may try to push the hernia back in place by very gently   pressing on it while lying down. Do not try to force the bulge back in if it will not push in easily.  Watch your hernia for any changes in shape, size, or color. Get help right away if you notice any changes.  Take over-the-counter and prescription medicines only as told by your health care provider.  Keep all follow-up visits. This is important. Contact a health care provider if:  You have a fever or chills.  You develop new symptoms.  Your symptoms get  worse. Get help right away if:  You have pain in your groin that suddenly gets worse.  You have a bulge in your groin that: ? Suddenly gets bigger and does not get smaller. ? Becomes red or purple or painful to the touch.  You are a man and you have a sudden pain in your scrotum, or the size of your scrotum suddenly changes.  You cannot push the hernia back in place by very gently pressing on it when you are lying down.  You have nausea or vomiting that does not go away.  You have a fast heartbeat.  You cannot have a bowel movement or pass gas. These symptoms may represent a serious problem that is an emergency. Do not wait to see if the symptoms will go away. Get medical help right away. Call your local emergency services (911 in the [place] Summary  An inguinal hernia develops when fat or the intestines push through a weak spot in a muscle where your leg meets your lower abdomen (groin).  This condition is caused by having a weak spot in muscles or tissues in your groin.  Symptoms may depend on the size of the hernia, and they may include pain or swelling in your groin. A small inguinal hernia often has no symptoms.  Treatment may not be needed if you do not have symptoms. If you have symptoms or a large hernia, you may need surgery to repair the hernia.  Avoid lifting heavy objects. Also, avoid standing for long periods of time. This information is not intended to replace advice given to you by your health care provider. Make sure you discuss any questions you have with your health care provider. Document Revised: 08/03/2020 Document Reviewed: 08/03/2020 Elsevier Patient Education  2021 [employer]  

## 2020-12-31 NOTE — H&P (Signed)
Joshua Kemp; [id number]; 12/02/1956   HPI Patient is a 64-year-old black male who was referred to my care by Dr. Hill for evaluation and treatment of a left inguinal hernia.  Patient states that the hernia has been present for some time now, but increases in size and does seem to extend down to the left testicle.  It is causing him discomfort when straining or while at work.  He denies any nausea or vomiting. Past Medical History:  Diagnosis Date  . Diabetes mellitus   . H/O chest wall injury   . Hiatal hernia   . Hypertension     Past Surgical History:  Procedure Laterality Date  . APPENDECTOMY      History reviewed. No pertinent family history.  Current Outpatient Medications on File Prior to Visit  Medication Sig Dispense Refill  . atorvastatin (LIPITOR) 20 MG tablet Take 20 mg by mouth at bedtime.    . famotidine (PEPCID) 20 MG tablet Take 20 mg by mouth daily.    . gabapentin (NEURONTIN) 400 MG capsule Take 300 mg by mouth at bedtime.    . loratadine (CLARITIN) 10 MG tablet Take 10 mg by mouth daily.    . meloxicam (MOBIC) 7.5 MG tablet Take 1-2 tablets (7.5-15 mg total) by mouth daily. 15 tablet 0  . metFORMIN (GLUCOPHAGE) 1000 MG tablet Take 1,000 mg by mouth 2 (two) times daily.    . metoCLOPramide (REGLAN) 5 MG tablet Take 5 mg by mouth 4 (four) times daily. Take 1 tablet 30 minutes before each meal and at bedtime for digestion    . omeprazole (PRILOSEC) 20 MG capsule Take 2 capsules by mouth daily.     . tamsulosin (FLOMAX) 0.4 MG CAPS capsule Take 0.4 mg by mouth at bedtime.     No current facility-administered medications on file prior to visit.    No Known Allergies  Social History   Substance and Sexual Activity  Alcohol Use No    Social History   Tobacco Use  Smoking Status Former Smoker  Smokeless Tobacco Never Used    Review of Systems  Constitutional: Negative.   HENT: Negative.   Eyes: Negative.   Respiratory: Negative.   Cardiovascular:  Negative.   Gastrointestinal: Negative.   Genitourinary: Negative.   Musculoskeletal: Negative.   Skin: Negative.   Neurological: Negative.   Endo/Heme/Allergies: Negative.   Psychiatric/Behavioral: Negative.     Objective   Vitals:   12/30/20 1048  BP: (!) 143/79  Pulse: 78  Resp: 16  Temp: (!) 96.8 F (36 C)  SpO2: 95%    Physical Exam Vitals reviewed.  Constitutional:      Appearance: Normal appearance. He is not ill-appearing.  HENT:     Head: Normocephalic and atraumatic.  Cardiovascular:     Rate and Rhythm: Normal rate and regular rhythm.     Heart sounds: Normal heart sounds. No murmur heard. No friction rub. No gallop.   Pulmonary:     Effort: Pulmonary effort is normal. No respiratory distress.     Breath sounds: Normal breath sounds. No stridor. No wheezing, rhonchi or rales.  Abdominal:     General: Bowel sounds are normal. There is no distension.     Palpations: Abdomen is soft. There is no mass.     Tenderness: There is no abdominal tenderness. There is no guarding or rebound.     Hernia: A hernia is present.     Comments: Reducible left inguinal hernia.  Genitourinary:      Testes: Normal.  Skin:    General: Skin is warm and dry.  Neurological:     Mental Status: He is alert and oriented to person, place, and time.   Primary care notes reviewed  Assessment  Left inguinal hernia Plan   Patient is scheduled for left inguinal herniorrhaphy with mesh on 01/14/2021.  The risks and benefits of the procedure including bleeding, infection, mesh use, chronic pain, and the possibility of recurrence of the hernia were fully explained to the patient, who gave informed consent. 

## 2020-12-31 NOTE — Progress Notes (Signed)
Joshua Kemp; [id number]; 05/31/1956   HPI Patient is a 65 year old black male who was referred to my care by Dr. Berdine Addison for evaluation and treatment of a left inguinal hernia.  Patient states that the hernia has been present for some time now, but increases in size and does seem to extend down to the left testicle.  It is causing him discomfort when straining or while at work.  He denies any nausea or vomiting. Past Medical History:  Diagnosis Date  . Diabetes mellitus   . H/O chest wall injury   . Hiatal hernia   . Hypertension     Past Surgical History:  Procedure Laterality Date  . APPENDECTOMY      History reviewed. No pertinent family history.  Current Outpatient Medications on File Prior to Visit  Medication Sig Dispense Refill  . atorvastatin (LIPITOR) 20 MG tablet Take 20 mg by mouth at bedtime.    . famotidine (PEPCID) 20 MG tablet Take 20 mg by mouth daily.    Marland Kitchen gabapentin (NEURONTIN) 400 MG capsule Take 300 mg by mouth at bedtime.    Marland Kitchen loratadine (CLARITIN) 10 MG tablet Take 10 mg by mouth daily.    . meloxicam (MOBIC) 7.5 MG tablet Take 1-2 tablets (7.5-15 mg total) by mouth daily. 15 tablet 0  . metFORMIN (GLUCOPHAGE) 1000 MG tablet Take 1,000 mg by mouth 2 (two) times daily.    . metoCLOPramide (REGLAN) 5 MG tablet Take 5 mg by mouth 4 (four) times daily. Take 1 tablet 30 minutes before each meal and at bedtime for digestion    . omeprazole (PRILOSEC) 20 MG capsule Take 2 capsules by mouth daily.     . tamsulosin (FLOMAX) 0.4 MG CAPS capsule Take 0.4 mg by mouth at bedtime.     No current facility-administered medications on file prior to visit.    No Known Allergies  Social History   Substance and Sexual Activity  Alcohol Use No    Social History   Tobacco Use  Smoking Status Former Smoker  Smokeless Tobacco Never Used    Review of Systems  Constitutional: Negative.   HENT: Negative.   Eyes: Negative.   Respiratory: Negative.   Cardiovascular:  Negative.   Gastrointestinal: Negative.   Genitourinary: Negative.   Musculoskeletal: Negative.   Skin: Negative.   Neurological: Negative.   Endo/Heme/Allergies: Negative.   Psychiatric/Behavioral: Negative.     Objective   Vitals:   12/30/20 1048  BP: (!) 143/79  Pulse: 78  Resp: 16  Temp: (!) 96.8 F (36 C)  SpO2: 95%    Physical Exam Vitals reviewed.  Constitutional:      Appearance: Normal appearance. He is not ill-appearing.  HENT:     Head: Normocephalic and atraumatic.  Cardiovascular:     Rate and Rhythm: Normal rate and regular rhythm.     Heart sounds: Normal heart sounds. No murmur heard. No friction rub. No gallop.   Pulmonary:     Effort: Pulmonary effort is normal. No respiratory distress.     Breath sounds: Normal breath sounds. No stridor. No wheezing, rhonchi or rales.  Abdominal:     General: Bowel sounds are normal. There is no distension.     Palpations: Abdomen is soft. There is no mass.     Tenderness: There is no abdominal tenderness. There is no guarding or rebound.     Hernia: A hernia is present.     Comments: Reducible left inguinal hernia.  Genitourinary:  Testes: Normal.  Skin:    General: Skin is warm and dry.  Neurological:     Mental Status: He is alert and oriented to person, place, and time.   Primary care notes reviewed  Assessment  Left inguinal hernia Plan   Patient is scheduled for left inguinal herniorrhaphy with mesh on 01/14/2021.  The risks and benefits of the procedure including bleeding, infection, mesh use, chronic pain, and the possibility of recurrence of the hernia were fully explained to the patient, who gave informed consent.

## 2021-01-10 NOTE — Patient Instructions (Signed)
Joshua Kemp  01/10/2021     @PREFPERIOPPHARMACY @   Your procedure is scheduled on  01/14/2021.    Report to [facility] at  0830  A.M.    Call this number if you have problems the morning of surgery:  [phone]     Do not eat or drink after midnight.                        Take these medicines the morning of surgery with A SIP OF WATER                Gabapentin, claritin, prilosec.   Please brush your teeth.  Do not wear jewelry, make-up or nail polish.  Do not wear lotions, powders, or perfumes, or deodorant.  Do not shave 48 hours prior to surgery.  Men may shave face and neck.  Do not bring valuables to the hospital.  [facility] is not responsible for any belongings or valuables.  Contacts, dentures or bridgework may not be worn into surgery.  Leave your suitcase in the car.  After surgery it may be brought to your room.  For patients admitted to the hospital, discharge time will be determined by your treatment team.  Patients discharged the day of surgery will not be allowed to drive home and the must have someone with them for 24 hours.   Special instructions:  DO NOT smoke (tobacco or vape) the morning of your procedure.   Shower the night before and the morning of your procedure with CHG. DO NOT put CHG on your face, hair or genitals(private parts).  Dry off with a clean towel after each shower.  After your night time shower, put on clean clothes to sleep in and have put clean sheets on your bed to sleep on.  After your morning shower, put on clean comfortable clothes and brush your teeth before you come to the hospital.  Please read over the following fact sheets that you were given. Coughing and Deep Breathing, Surgical Site Infection Prevention, Anesthesia Post-op Instructions and Care and Recovery After Surgery       Open Hernia Repair, Adult, Care After What can I expect after the procedure? After the procedure, it is common to  have:  Mild discomfort.  Slight bruising.  Mild swelling.  Pain in the belly (abdomen).  A small amount of blood from the cut from surgery (incision). Follow these instructions at home: Your doctor may give you more specific instructions. If you have problems, call your doctor. Medicines  Take over-the-counter and prescription medicines only as told by your doctor.  If told, take steps to prevent problems with pooping (constipation). You may need to: ? Drink enough fluid to keep your pee (urine) pale yellow. ? Take medicines. You will be told what medicines to take. ? Eat foods that are high in fiber. These include beans, whole grains, and fresh fruits and vegetables. ? Limit foods that are high in fat and sugar. These include fried or sweet foods.  Ask your doctor if you should avoid driving or using machines while you are taking your medicine. Incision care  Follow instructions from your doctor about how to take care of your incision. Make sure you: ? Wash your hands with soap and water for at least 20 seconds before and after you change your bandage (dressing). If you cannot use soap and water, use hand sanitizer. ? Change your bandage. ?  Leave stitches or skin glue in place for at least 2 weeks. ? Leave tape strips alone unless you are told to take them off. You may trim the edges of the tape strips if they curl up.  Check your incision every day for signs of infection. Check for: ? More redness, swelling, or pain. ? More fluid or blood. ? Warmth. ? Pus or a bad smell.  Wear loose, soft clothing while your incision heals.   Activity  Rest as told by your doctor.  Do not lift anything that is heavier than 10 lb (4.5 kg), or the limit that you are told.  Do not play contact sports until your doctor says that this is safe.  If you were given a sedative during your procedure, do not drive or use machines until your doctor says that it is safe. A sedative is a medicine  that helps you relax.  Return to your normal activities when your doctor says that it is safe.   General instructions  Do not take baths, swim, or use a hot tub. Ask your doctor about taking showers or sponge baths.  Hold a pillow over your belly when you cough or sneeze. This helps with pain.  Do not smoke or use any products that contain nicotine or tobacco. If you need help quitting, ask your doctor.  Keep all follow-up visits. Contact a doctor if:  You have any of these signs of infection in or around your incision: ? More redness, swelling, or pain. ? More fluid or blood. ? Warmth. ? Pus. ? A bad smell.  You have a fever or chills.  You have blood in your poop (stool).  You have not pooped (had a bowel movement) in 2-3 days.  Medicine does not help your pain. Get help right away if:  You have chest pain, or you are short of breath.  You feel faint or light-headed.  You have very bad pain.  You vomit and your pain is worse.  You have pain, swelling, or redness in a leg. These symptoms may be an emergency. Get help right away. Call your local emergency services (911 in the [place]  Do not wait to see if the symptoms will go away.  Do not drive yourself to the hospital. Summary  After this procedure, it is common to have mild discomfort, slight bruising, and mild swelling.  Follow instructions from your doctor about how to take care of your cut from surgery (incision). Check every day for signs of infection.  Do not lift heavy objects or play contact sports until your doctor says it is safe.  Return to your normal activities as told by your doctor. This information is not intended to replace advice given to you by your health care provider. Make sure you discuss any questions you have with your health care provider. Document Revised: 07/19/2020 Document Reviewed: 07/19/2020 Elsevier Patient Education  2021 [place] Anesthesia, Adult, Care  After This sheet gives you information about how to care for yourself after your procedure. Your health care provider may also give you more specific instructions. If you have problems or questions, contact your health care provider. What can I expect after the procedure? After the procedure, the following side effects are common:  Pain or discomfort at the IV site.  Nausea.  Vomiting.  Sore throat.  Trouble concentrating.  Feeling cold or chills.  Feeling weak or tired.  Sleepiness and fatigue.  Soreness and body aches. These side  effects can affect parts of the body that were not involved in surgery. Follow these instructions at home: For the time period you were told by your health care provider:  Rest.  Do not participate in activities where you could fall or become injured.  Do not drive or use machinery.  Do not drink alcohol.  Do not take sleeping pills or medicines that cause drowsiness.  Do not make important decisions or sign legal documents.  Do not take care of children on your own.   Eating and drinking  Follow any instructions from your health care provider about eating or drinking restrictions.  When you feel hungry, start by eating small amounts of foods that are soft and easy to digest (bland), such as toast. Gradually return to your regular diet.  Drink enough fluid to keep your urine pale yellow.  If you vomit, rehydrate by drinking water, juice, or clear broth. General instructions  If you have sleep apnea, surgery and certain medicines can increase your risk for breathing problems. Follow instructions from your health care provider about wearing your sleep device: ? Anytime you are sleeping, including during daytime naps. ? While taking prescription pain medicines, sleeping medicines, or medicines that make you drowsy.  Have a responsible adult stay with you for the time you are told. It is important to have someone help care for you until you  are awake and alert.  Return to your normal activities as told by your health care provider. Ask your health care provider what activities are safe for you.  Take over-the-counter and prescription medicines only as told by your health care provider.  If you smoke, do not smoke without supervision.  Keep all follow-up visits as told by your health care provider. This is important. Contact a health care provider if:  You have nausea or vomiting that does not get better with medicine.  You cannot eat or drink without vomiting.  You have pain that does not get better with medicine.  You are unable to pass urine.  You develop a skin rash.  You have a fever.  You have redness around your IV site that gets worse. Get help right away if:  You have difficulty breathing.  You have chest pain.  You have blood in your urine or stool, or you vomit blood. Summary  After the procedure, it is common to have a sore throat or nausea. It is also common to feel tired.  Have a responsible adult stay with you for the time you are told. It is important to have someone help care for you until you are awake and alert.  When you feel hungry, start by eating small amounts of foods that are soft and easy to digest (bland), such as toast. Gradually return to your regular diet.  Drink enough fluid to keep your urine pale yellow.  Return to your normal activities as told by your health care provider. Ask your health care provider what activities are safe for you. This information is not intended to replace advice given to you by your health care provider. Make sure you discuss any questions you have with your health care provider. Document Revised: 08/19/2020 Document Reviewed: 03/18/2020 Elsevier Patient Education  2021 [employer].

## 2021-01-12 ENCOUNTER — Encounter (HOSPITAL_COMMUNITY): Payer: Self-pay

## 2021-01-12 ENCOUNTER — Other Ambulatory Visit: Payer: Self-pay

## 2021-01-12 ENCOUNTER — Other Ambulatory Visit (HOSPITAL_COMMUNITY)
Admission: RE | Admit: 2021-01-12 | Discharge: 2021-01-12 | Disposition: A | Discharge status: Home / Self Care | Payer: Medicare Other | Source: Ambulatory Visit | Attending: General Surgery | Admitting: General Surgery

## 2021-01-12 ENCOUNTER — Encounter (HOSPITAL_COMMUNITY)
Admission: RE | Admit: 2021-01-12 | Discharge: 2021-01-12 | Disposition: A | Discharge status: Home / Self Care | Payer: Medicare Other | Source: Ambulatory Visit | Attending: General Surgery | Admitting: General Surgery

## 2021-01-12 DIAGNOSIS — Z20822 Contact with and (suspected) exposure to covid-19: Secondary | ICD-10-CM | POA: Insufficient documentation

## 2021-01-12 DIAGNOSIS — Z01818 Encounter for other preprocedural examination: Secondary | ICD-10-CM | POA: Diagnosis not present

## 2021-01-12 HISTORY — DX: Unspecified osteoarthritis, unspecified site: M19.90

## 2021-01-12 LAB — BASIC METABOLIC PANEL
Anion gap: 9 (ref 5–15)
BUN: 16 mg/dL (ref 8–23)
CO2: 23 mmol/L (ref 22–32)
Calcium: 9.8 mg/dL (ref 8.9–10.3)
Chloride: 100 mmol/L (ref 98–111)
Creatinine, Ser: 0.94 mg/dL (ref 0.61–1.24)
GFR, Estimated: >60 mL/min (ref >60)
Glucose, Bld: 217 mg/dL — ABNORMAL HIGH (ref 70–99)
Potassium: 3.8 mmol/L (ref 3.5–5.1)
Sodium: 132 mmol/L — ABNORMAL LOW (ref 135–145)

## 2021-01-12 LAB — CBC WITH DIFFERENTIAL/PLATELET
Abs Immature Granulocytes: 0.01 10*3/uL (ref 0.00–0.07)
Basophils Absolute: 0 10*3/uL (ref 0.0–0.1)
Basophils Relative: 1 %
Eosinophils Absolute: 0.2 10*3/uL (ref 0.0–0.5)
Eosinophils Relative: 3 %
HCT: 46.4 % (ref 39.0–52.0)
Hemoglobin: 15.4 g/dL (ref 13.0–17.0)
Immature Granulocytes: 0 %
Lymphocytes Relative: 37 %
Lymphs Abs: 1.9 10*3/uL (ref 0.7–4.0)
MCH: 30.3 pg (ref 26.0–34.0)
MCHC: 33.2 g/dL (ref 30.0–36.0)
MCV: 91.2 fL (ref 80.0–100.0)
Monocytes Absolute: 0.5 10*3/uL (ref 0.1–1.0)
Monocytes Relative: 10 %
Neutro Abs: 2.6 10*3/uL (ref 1.7–7.7)
Neutrophils Relative %: 49 %
Platelets: 195 10*3/uL (ref 150–400)
RBC: 5.09 MIL/uL (ref 4.22–5.81)
RDW: 12 % (ref 11.5–15.5)
WBC: 5.3 10*3/uL (ref 4.0–10.5)
nRBC: 0 % (ref 0.0–0.2)

## 2021-01-12 LAB — HEMOGLOBIN A1C
Hgb A1c MFr Bld: 6.1 % — ABNORMAL HIGH (ref 4.8–5.6)
Mean Plasma Glucose: 128.37 mg/dL

## 2021-01-12 LAB — SARS CORONAVIRUS 2 (TAT 6-24 HRS): SARS Coronavirus 2: NEGATIVE

## 2021-01-14 ENCOUNTER — Encounter (HOSPITAL_COMMUNITY)
Admission: RE | Disposition: A | Discharge status: Home / Self Care | Payer: Self-pay | Source: Home / Self Care | Attending: General Surgery

## 2021-01-14 ENCOUNTER — Ambulatory Visit (HOSPITAL_COMMUNITY)
Admission: RE | Admit: 2021-01-14 | Discharge: 2021-01-14 | Disposition: A | Discharge status: Home / Self Care | Payer: Medicare Other | Attending: General Surgery | Admitting: General Surgery

## 2021-01-14 ENCOUNTER — Ambulatory Visit (HOSPITAL_COMMUNITY): Payer: Medicare Other | Admitting: Anesthesiology

## 2021-01-14 ENCOUNTER — Encounter (HOSPITAL_COMMUNITY): Payer: Self-pay | Admitting: General Surgery

## 2021-01-14 DIAGNOSIS — Z79899 Other long term (current) drug therapy: Secondary | ICD-10-CM | POA: Diagnosis not present

## 2021-01-14 DIAGNOSIS — Z7984 Long term (current) use of oral hypoglycemic drugs: Secondary | ICD-10-CM | POA: Diagnosis not present

## 2021-01-14 DIAGNOSIS — K4090 Unilateral inguinal hernia, without obstruction or gangrene, not specified as recurrent: Secondary | ICD-10-CM | POA: Diagnosis not present

## 2021-01-14 DIAGNOSIS — Z87891 Personal history of nicotine dependence: Secondary | ICD-10-CM | POA: Diagnosis not present

## 2021-01-14 HISTORY — PX: INGUINAL HERNIA REPAIR: SHX194

## 2021-01-14 LAB — GLUCOSE, CAPILLARY
Glucose-Capillary: 193 mg/dL — ABNORMAL HIGH (ref 70–99)
Glucose-Capillary: 169 mg/dL — ABNORMAL HIGH (ref 70–99)

## 2021-01-14 SURGERY — REPAIR, HERNIA, INGUINAL, ADULT
Anesthesia: General | Site: Inguinal | Laterality: Left

## 2021-01-14 MED ORDER — CHLORHEXIDINE GLUCONATE 0.12 % MT SOLN
15.0000 mL | Freq: Once | OROMUCOSAL | Status: AC
  Administered 2021-01-14: 15 mL via OROMUCOSAL

## 2021-01-14 MED ORDER — DEXAMETHASONE SODIUM PHOSPHATE 10 MG/ML IJ SOLN
INTRAMUSCULAR | Status: DC | PRN
  Administered 2021-01-14: 5 mg via INTRAVENOUS

## 2021-01-14 MED ORDER — DEXAMETHASONE SODIUM PHOSPHATE 10 MG/ML IJ SOLN
INTRAMUSCULAR | Status: AC
  Filled 2021-01-14: qty 1

## 2021-01-14 MED ORDER — ORAL CARE MOUTH RINSE: 15.0000 mL | Freq: Once | OROMUCOSAL | Status: AC

## 2021-01-14 MED ORDER — CEFAZOLIN SODIUM-DEXTROSE 2-4 GM/100ML-% IV SOLN
2.0000 g | INTRAVENOUS | Status: AC
  Administered 2021-01-14: 2 g via INTRAVENOUS
  Filled 2021-01-14: qty 100

## 2021-01-14 MED ORDER — FENTANYL CITRATE (PF) 100 MCG/2ML IJ SOLN
INTRAMUSCULAR | Status: AC
  Filled 2021-01-14: qty 2

## 2021-01-14 MED ORDER — BUPIVACAINE LIPOSOME 1.3 % IJ SUSP
INTRAMUSCULAR | Status: AC
  Filled 2021-01-14: qty 20

## 2021-01-14 MED ORDER — ONDANSETRON HCL 4 MG/2ML IJ SOLN
INTRAMUSCULAR | Status: DC | PRN
  Administered 2021-01-14: 4 mg via INTRAVENOUS

## 2021-01-14 MED ORDER — PROPOFOL 10 MG/ML IV BOLUS
INTRAVENOUS | Status: DC | PRN
  Administered 2021-01-14: 200 mg via INTRAVENOUS

## 2021-01-14 MED ORDER — FENTANYL CITRATE (PF) 100 MCG/2ML IJ SOLN: 25.0000 ug | INTRAMUSCULAR | Status: DC | PRN

## 2021-01-14 MED ORDER — LIDOCAINE HCL (PF) 2 % IJ SOLN
INTRAMUSCULAR | Status: AC
  Filled 2021-01-14: qty 10

## 2021-01-14 MED ORDER — DEXMEDETOMIDINE (PRECEDEX) IN NS 20 MCG/5ML (4 MCG/ML) IV SYRINGE
PREFILLED_SYRINGE | INTRAVENOUS | Status: AC
  Filled 2021-01-14: qty 5

## 2021-01-14 MED ORDER — PROPOFOL 10 MG/ML IV BOLUS
INTRAVENOUS | Status: AC
  Filled 2021-01-14: qty 20

## 2021-01-14 MED ORDER — SODIUM CHLORIDE 0.9 % IR SOLN
Status: DC | PRN
  Administered 2021-01-14: 1000 mL

## 2021-01-14 MED ORDER — CHLORHEXIDINE GLUCONATE CLOTH 2 % EX PADS: 6.0000 | MEDICATED_PAD | Freq: Once | CUTANEOUS | Status: DC

## 2021-01-14 MED ORDER — FENTANYL CITRATE (PF) 250 MCG/5ML IJ SOLN
INTRAMUSCULAR | Status: DC | PRN
  Administered 2021-01-14 (×5): 50 ug via INTRAVENOUS

## 2021-01-14 MED ORDER — PHENYLEPHRINE HCL (PRESSORS) 10 MG/ML IV SOLN
INTRAVENOUS | Status: DC | PRN
  Administered 2021-01-14 (×4): 80 ug via INTRAVENOUS

## 2021-01-14 MED ORDER — ONDANSETRON HCL 4 MG/2ML IJ SOLN: 4.0000 mg | Freq: Once | INTRAMUSCULAR | Status: DC | PRN

## 2021-01-14 MED ORDER — DEXMEDETOMIDINE (PRECEDEX) IN NS 20 MCG/5ML (4 MCG/ML) IV SYRINGE
PREFILLED_SYRINGE | INTRAVENOUS | Status: DC | PRN
  Administered 2021-01-14: 12 ug via INTRAVENOUS

## 2021-01-14 MED ORDER — LIDOCAINE HCL (CARDIAC) PF 100 MG/5ML IV SOSY
PREFILLED_SYRINGE | INTRAVENOUS | Status: DC | PRN
  Administered 2021-01-14: 100 mg via INTRAVENOUS

## 2021-01-14 MED ORDER — ONDANSETRON HCL 4 MG/2ML IJ SOLN
INTRAMUSCULAR | Status: AC
  Filled 2021-01-14: qty 2

## 2021-01-14 MED ORDER — LACTATED RINGERS IV SOLN
INTRAVENOUS | Status: DC
  Administered 2021-01-14: 1000 mL via INTRAVENOUS

## 2021-01-14 MED ORDER — KETOROLAC TROMETHAMINE 30 MG/ML IJ SOLN
30.0000 mg | Freq: Once | INTRAMUSCULAR | Status: AC
  Administered 2021-01-14: 30 mg via INTRAVENOUS
  Filled 2021-01-14: qty 1

## 2021-01-14 MED ORDER — EPHEDRINE SULFATE 50 MG/ML IJ SOLN
INTRAMUSCULAR | Status: DC | PRN
  Administered 2021-01-14 (×2): 10 mg via INTRAVENOUS

## 2021-01-14 MED ORDER — HYDROCODONE-ACETAMINOPHEN 10-325 MG PO TABS: 1.0000 | ORAL_TABLET | Freq: Four times a day (QID) | ORAL | 0 refills | Status: AC | PRN

## 2021-01-14 MED ORDER — BUPIVACAINE LIPOSOME 1.3 % IJ SUSP
INTRAMUSCULAR | Status: DC | PRN
  Administered 2021-01-14: 20 mL

## 2021-01-14 SURGICAL SUPPLY — 39 items
ADH SKN CLS APL DERMABOND .7 (GAUZE/BANDAGES/DRESSINGS) ×1
CLOTH BEACON ORANGE TIMEOUT ST (SAFETY) ×2 IMPLANT
COVER LIGHT HANDLE STERIS (MISCELLANEOUS) ×4 IMPLANT
COVER WAND RF STERILE (DRAPES) ×2 IMPLANT
DERMABOND ADVANCED (GAUZE/BANDAGES/DRESSINGS) ×1
DERMABOND ADVANCED .7 DNX12 (GAUZE/BANDAGES/DRESSINGS) ×1 IMPLANT
DRAIN PENROSE 0.5X18 (DRAIN) ×2 IMPLANT
ELECT REM PT RETURN 9FT ADLT (ELECTROSURGICAL) ×2
ELECTRODE REM PT RTRN 9FT ADLT (ELECTROSURGICAL) ×1 IMPLANT
GAUZE SPONGE 4X4 12PLY STRL (GAUZE/BANDAGES/DRESSINGS) ×2 IMPLANT
GLOVE BIO SURGEON STRL SZ7 (GLOVE) ×2 IMPLANT
GLOVE ECLIPSE 6.5 STRL STRAW (GLOVE) ×2 IMPLANT
GLOVE SURG SS PI 7.5 STRL IVOR (GLOVE) ×2 IMPLANT
GLOVE SURG UNDER POLY LF SZ7 (GLOVE) ×6 IMPLANT
GOWN STRL REUS W/TWL LRG LVL3 (GOWN DISPOSABLE) ×6 IMPLANT
INST SET MINOR GENERAL (KITS) ×2 IMPLANT
KIT TURNOVER KIT A (KITS) ×2 IMPLANT
MANIFOLD NEPTUNE II (INSTRUMENTS) ×2 IMPLANT
MESH HERNIA 1.6X1.9 PLUG LRG (Mesh General) ×1 IMPLANT
MESH HERNIA PLUG LRG (Mesh General) ×1 IMPLANT
NEEDLE HYPO 18GX1.5 BLUNT FILL (NEEDLE) ×2 IMPLANT
NEEDLE HYPO 21X1.5 SAFETY (NEEDLE) ×2 IMPLANT
NS IRRIG 1000ML POUR BTL (IV SOLUTION) ×2 IMPLANT
PACK MINOR (CUSTOM PROCEDURE TRAY) ×2 IMPLANT
PAD ARMBOARD 7.5X6 YLW CONV (MISCELLANEOUS) ×2 IMPLANT
PENCIL SMOKE EVACUATOR (MISCELLANEOUS) ×2 IMPLANT
SET BASIN LINEN APH (SET/KITS/TRAYS/PACK) ×2 IMPLANT
SOL PREP PROV IODINE SCRUB 4OZ (MISCELLANEOUS) ×2 IMPLANT
SUT MNCRL AB 4-0 PS2 18 (SUTURE) ×2 IMPLANT
SUT NOVA NAB GS-22 2 2-0 T-19 (SUTURE) ×6 IMPLANT
SUT SILK 3 0 (SUTURE)
SUT SILK 3-0 18XBRD TIE 12 (SUTURE) IMPLANT
SUT VIC AB 2-0 CT1 27 (SUTURE) ×2
SUT VIC AB 2-0 CT1 TAPERPNT 27 (SUTURE) ×1 IMPLANT
SUT VIC AB 3-0 SH 27 (SUTURE) ×2
SUT VIC AB 3-0 SH 27X BRD (SUTURE) ×1 IMPLANT
SUT VICRYL AB 2 0 TIES (SUTURE) ×2 IMPLANT
SUT VICRYL AB 3 0 TIES (SUTURE) IMPLANT
SYR 20ML LL LF (SYRINGE) ×4 IMPLANT

## 2021-01-14 NOTE — Interval H&P Note (Signed)
History and Physical Interval Note:  01/14/2021 9:44 AM  Joshua Kemp  has presented today for surgery, with the diagnosis of Left inguinal hernia.  The various methods of treatment have been discussed with the patient and family. After consideration of risks, benefits and other options for treatment, the patient has consented to  Procedure(s): HERNIA REPAIR INGUINAL ADULT (Left) as a surgical intervention.  The patient's history has been reviewed, patient examined, no change in status, stable for surgery.  I have reviewed the patient's chart and labs.  Questions were answered to the patient's satisfaction.     Aviva Signs

## 2021-01-14 NOTE — Discharge Instructions (Signed)
Open Hernia Repair, Adult, Care After What can I expect after the procedure? After the procedure, it is common to have:  Mild discomfort.  Slight bruising.  Mild swelling.  Pain in the belly (abdomen).  A small amount of blood from the cut from surgery (incision). Follow these instructions at home: Your doctor may give you more specific instructions. If you have problems, call your doctor. Medicines  Take over-the-counter and prescription medicines only as told by your doctor.  If told, take steps to prevent problems with pooping (constipation). You may need to: ? Drink enough fluid to keep your pee (urine) pale yellow. ? Take medicines. You will be told what medicines to take. ? Eat foods that are high in fiber. These include beans, whole grains, and fresh fruits and vegetables. ? Limit foods that are high in fat and sugar. These include fried or sweet foods.  Ask your doctor if you should avoid driving or using machines while you are taking your medicine. Incision care  Follow instructions from your doctor about how to take care of your incision. Make sure you: ? Wash your hands with soap and water for at least 20 seconds before and after you change your bandage (dressing). If you cannot use soap and water, use hand sanitizer. ? Change your bandage. ? Leave stitches or skin glue in place for at least 2 weeks. ? Leave tape strips alone unless you are told to take them off. You may trim the edges of the tape strips if they curl up.  Check your incision every day for signs of infection. Check for: ? More redness, swelling, or pain. ? More fluid or blood. ? Warmth. ? Pus or a bad smell.  Wear loose, soft clothing while your incision heals.   Activity  Rest as told by your doctor.  Do not lift anything that is heavier than 10 lb (4.5 kg), or the limit that you are told.  Do not play contact sports until your doctor says that this is safe.  If you were given a sedative  during your procedure, do not drive or use machines until your doctor says that it is safe. A sedative is a medicine that helps you relax.  Return to your normal activities when your doctor says that it is safe.   General instructions  Do not take baths, swim, or use a hot tub. Ask your doctor about taking showers or sponge baths.  Hold a pillow over your belly when you cough or sneeze. This helps with pain.  Do not smoke or use any products that contain nicotine or tobacco. If you need help quitting, ask your doctor.  Keep all follow-up visits. Contact a doctor if:  You have any of these signs of infection in or around your incision: ? More redness, swelling, or pain. ? More fluid or blood. ? Warmth. ? Pus. ? A bad smell.  You have a fever or chills.  You have blood in your poop (stool).  You have not pooped (had a bowel movement) in 2-3 days.  Medicine does not help your pain. Get help right away if:  You have chest pain, or you are short of breath.  You feel faint or light-headed.  You have very bad pain.  You vomit and your pain is worse.  You have pain, swelling, or redness in a leg. These symptoms may be an emergency. Get help right away. Call your local emergency services (911 in the [place]  Do not  wait to see if the symptoms will go away.  Do not drive yourself to the hospital. Summary  After this procedure, it is common to have mild discomfort, slight bruising, and mild swelling.  Follow instructions from your doctor about how to take care of your cut from surgery (incision). Check every day for signs of infection.  Do not lift heavy objects or play contact sports until your doctor says it is safe.  Return to your normal activities as told by your doctor. This information is not intended to replace advice given to you by your health care provider. Make sure you discuss any questions you have with your health care provider. Document Revised: 07/19/2020  Document Reviewed: 07/19/2020 Elsevier Patient Education  2021 [place] Anesthesia, Adult, Care After This sheet gives you information about how to care for yourself after your procedure. Your health care provider may also give you more specific instructions. If you have problems or questions, contact your health care provider. What can I expect after the procedure? After the procedure, the following side effects are common:  Pain or discomfort at the IV site.  Nausea.  Vomiting.  Sore throat.  Trouble concentrating.  Feeling cold or chills.  Feeling weak or tired.  Sleepiness and fatigue.  Soreness and body aches. These side effects can affect parts of the body that were not involved in surgery. Follow these instructions at home: For the time period you were told by your health care provider:  Rest.  Do not participate in activities where you could fall or become injured.  Do not drive or use machinery.  Do not drink alcohol.  Do not take sleeping pills or medicines that cause drowsiness.  Do not make important decisions or sign legal documents.  Do not take care of children on your own.   Eating and drinking  Follow any instructions from your health care provider about eating or drinking restrictions.  When you feel hungry, start by eating small amounts of foods that are soft and easy to digest (bland), such as toast. Gradually return to your regular diet.  Drink enough fluid to keep your urine pale yellow.  If you vomit, rehydrate by drinking water, juice, or clear broth. General instructions  If you have sleep apnea, surgery and certain medicines can increase your risk for breathing problems. Follow instructions from your health care provider about wearing your sleep device: ? Anytime you are sleeping, including during daytime naps. ? While taking prescription pain medicines, sleeping medicines, or medicines that make you drowsy.  Have a  responsible adult stay with you for the time you are told. It is important to have someone help care for you until you are awake and alert.  Return to your normal activities as told by your health care provider. Ask your health care provider what activities are safe for you.  Take over-the-counter and prescription medicines only as told by your health care provider.  If you smoke, do not smoke without supervision.  Keep all follow-up visits as told by your health care provider. This is important. Contact a health care provider if:  You have nausea or vomiting that does not get better with medicine.  You cannot eat or drink without vomiting.  You have pain that does not get better with medicine.  You are unable to pass urine.  You develop a skin rash.  You have a fever.  You have redness around your IV site that gets worse. Get help  right away if:  You have difficulty breathing.  You have chest pain.  You have blood in your urine or stool, or you vomit blood. Summary  After the procedure, it is common to have a sore throat or nausea. It is also common to feel tired.  Have a responsible adult stay with you for the time you are told. It is important to have someone help care for you until you are awake and alert.  When you feel hungry, start by eating small amounts of foods that are soft and easy to digest (bland), such as toast. Gradually return to your regular diet.  Drink enough fluid to keep your urine pale yellow.  Return to your normal activities as told by your health care provider. Ask your health care provider what activities are safe for you. This information is not intended to replace advice given to you by your health care provider. Make sure you discuss any questions you have with your health care provider. Document Revised: 08/19/2020 Document Reviewed: 03/18/2020 Elsevier Patient Education  2021 [employer].

## 2021-01-14 NOTE — Op Note (Signed)
Patient:  Joshua Kemp  DOB:  [birth year]  MRN:  [id number]   Preop Diagnosis: Left inguinal hernia  Postop Diagnosis: Same  Procedure: Left inguinal herniorrhaphy with mesh  Surgeon: Aviva Signs, MD  Anes: General  Indications: Patient is a 65 year old white male who presents with a symptomatic left inguinal hernia.  The risks and benefits of the procedure including bleeding, infection, mesh use, and the possibility of recurrence of the hernia were fully explained to the patient, who gave informed consent.  Procedure note: The patient was placed in the supine position.  After general anesthesia was administered, the left groin region was prepped and draped using the usual sterile technique with Betadine.  Surgical site confirmation was performed.  An incision was made in the left groin region down to the external oblique aponeurosis.  The aponeurosis was incised to the external ring.  A Penrose drain was placed around the spermatic cord.  The vas deferens was noted within the spermatic cord.  The ilioinguinal nerve was identified and retracted superiorly from the operative field.  The patient had a large lipoma of the cord.  A high ligation of this was performed using a 2-0 Vicryl tie.  It was excised and disposed of.  The indirect hernia defect was noted.  The hernia sac was freed away from the spermatic cord up to the peritoneal reflection and inverted.  A large Bard PerFix plug was then placed.  An onlay patch was placed along the floor of the inguinal canal and secured superiorly to the conjoined tendon and inferiorly to the shelving edge of Poupart's ligament using 2-0 Novafil interrupted sutures.  The internal ring was recreated using a 2-0 Novafil interrupted suture.  The external Bleich aponeurosis was reapproximated using a 2-0 Vicryl running suture.  The subcutaneous layer was reapproximated using 3-0 Vicryl interrupted sutures.  Exparel was instilled into the surrounding wound.   The skin was closed using a 4-0 Monocryl subcuticular suture.  Dermabond was applied.  All tape and needle counts were correct at the end of the procedure.  The patient was awakened and transferred to PACU in stable condition.  Complications: None  EBL: Minimal  Specimen: None

## 2021-01-14 NOTE — Transfer of Care (Signed)
Immediate Anesthesia Transfer of Care Note  Patient: LESEAN WOOLVERTON  Procedure(s) Performed: HERNIA REPAIR INGUINAL ADULT (Left Inguinal)  Patient Location: PACU  Anesthesia Type:General  Level of Consciousness: drowsy  Airway & Oxygen Therapy: Patient Spontanous Breathing and Patient connected to nasal cannula oxygen  Post-op Assessment: Report given to RN and Post -op Vital signs reviewed and stable  Post vital signs: Reviewed and stable  Last Vitals:  Vitals Value Taken Time  BP 124/78   Temp 97.7   Pulse 85   Resp 14   SpO2 92     Last Pain:  Vitals:   01/14/21 0857  TempSrc: Oral  PainSc: 0-No pain      Patients Stated Pain Goal: 8 (21/97/58 8325)  Complications: No complications documented.

## 2021-01-14 NOTE — Anesthesia Preprocedure Evaluation (Signed)
Anesthesia Evaluation  Patient identified by MRN, date of birth, ID band Patient awake    Reviewed: Allergy & Precautions, H&P , NPO status , Patient's Chart, lab work & pertinent test results, reviewed documented beta blocker date and time   Airway Mallampati: II  TM Distance: >3 FB Neck ROM: full    Dental no notable dental hx.    Pulmonary neg pulmonary ROS, former smoker,    Pulmonary exam normal breath sounds clear to auscultation       Cardiovascular Exercise Tolerance: Good hypertension, negative cardio ROS   Rhythm:regular Rate:Normal     Neuro/Psych negative neurological ROS  negative psych ROS   GI/Hepatic Neg liver ROS, hiatal hernia, GERD  Medicated,  Endo/Other  negative endocrine ROSdiabetes, Type 2  Renal/GU negative Renal ROS  negative genitourinary   Musculoskeletal   Abdominal   Peds  Hematology negative hematology ROS (+)   Anesthesia Other Findings   Reproductive/Obstetrics negative OB ROS                             Anesthesia Physical Anesthesia Plan  ASA: II  Anesthesia Plan: General   Post-op Pain Management:    Induction:   PONV Risk Score and Plan: Propofol infusion and TIVA  Airway Management Planned:   Additional Equipment:   Intra-op Plan:   Post-operative Plan:   Informed Consent: I have reviewed the patients History and Physical, chart, labs and discussed the procedure including the risks, benefits and alternatives for the proposed anesthesia with the patient or authorized representative who has indicated his/her understanding and acceptance.     Dental Advisory Given  Plan Discussed with: CRNA  Anesthesia Plan Comments:         Anesthesia Quick Evaluation

## 2021-01-14 NOTE — Anesthesia Procedure Notes (Signed)
Procedure Name: LMA Insertion Date/Time: 01/14/2021 10:21 AM Performed by: Karna Dupes, CRNA Pre-anesthesia Checklist: Patient identified, Emergency Drugs available, Suction available and Patient being monitored Patient Re-evaluated:Patient Re-evaluated prior to induction Oxygen Delivery Method: Circle system utilized Preoxygenation: Pre-oxygenation with 100% oxygen Induction Type: IV induction LMA: LMA inserted LMA Size: 4.0 Number of attempts: 1 Tube secured with: Tape Dental Injury: Teeth and Oropharynx as per pre-operative assessment

## 2021-01-15 NOTE — Anesthesia Postprocedure Evaluation (Signed)
Anesthesia Post Note  Patient: Joshua Kemp  Procedure(s) Performed: HERNIA REPAIR INGUINAL ADULT (Left Inguinal)  Patient location during evaluation: Phase II Anesthesia Type: General Level of consciousness: awake Pain management: pain level controlled Vital Signs Assessment: post-procedure vital signs reviewed and stable Respiratory status: spontaneous breathing Cardiovascular status: blood pressure returned to baseline Anesthetic complications: no   No complications documented.   Last Vitals:  Vitals:   01/14/21 1215 01/14/21 1223  BP: 140/84 (!) 147/84  Pulse: 85 85  Resp: 15 17  Temp:  36.4 C  SpO2: 96% 97%    Last Pain:  Vitals:   01/14/21 1223  TempSrc: Oral  PainSc: [place]

## 2021-01-17 ENCOUNTER — Encounter (HOSPITAL_COMMUNITY): Payer: Self-pay | Admitting: General Surgery

## 2021-01-17 ENCOUNTER — Telehealth: Payer: Self-pay | Admitting: General Surgery

## 2021-01-17 NOTE — Telephone Encounter (Signed)
Patient's wife, Ivin Booty, called and stated that the patient had surgery on 01-14-2021 and was experiencing a lot of gas and has been unable to have a bowel movement. Patient has tried Miralax and has been unsuccessful. Mrs. Zumstein would like for someone to call her back with suggestions.

## 2021-01-17 NOTE — Telephone Encounter (Signed)
Called pt and spoke to wife - pt can use miralax q 4 hours until movement and then can use stool softener while on pain medication. Wife states that he would like to do an enema to "jump start" his movements. I agreed and informed that would be safe for you to administer. She was appreciative of return call and will call us back in a few days if this does not help.

## 2021-01-21 ENCOUNTER — Telehealth (INDEPENDENT_AMBULATORY_CARE_PROVIDER_SITE_OTHER): Payer: Medicare Other | Admitting: General Surgery

## 2021-01-21 ENCOUNTER — Encounter: Payer: Self-pay | Admitting: General Surgery

## 2021-01-21 DIAGNOSIS — Z09 Encounter for follow-up examination after completed treatment for conditions other than malignant neoplasm: Secondary | ICD-10-CM

## 2021-01-21 NOTE — Progress Notes (Signed)
Subjective:     [facility] telephone visit performed.  Patient was on his cell phone and I was in the office.  He states he is doing very well.  He had some constipation initially but that has since resolved.  He denies any incisional pain. Objective:    There were no vitals taken for this visit.  General:  alert, cooperative and no distress       Assessment:    Doing well postoperatively.    Plan:   He was instructed to increase his activity as able.  He was to call me back should he have any issues.  He is pleased with the results.  Follow-up with me as needed.  Total telephone time was 1-1/2 minutes.  As this was a postoperative visit, it is not billable as it is a part of the global fee.

## 2021-02-14 DIAGNOSIS — E1169 Type 2 diabetes mellitus with other specified complication: Secondary | ICD-10-CM | POA: Diagnosis not present

## 2021-02-14 DIAGNOSIS — E781 Pure hyperglyceridemia: Secondary | ICD-10-CM | POA: Diagnosis not present

## 2021-02-14 DIAGNOSIS — I10 Essential (primary) hypertension: Secondary | ICD-10-CM | POA: Diagnosis not present

## 2021-03-10 DIAGNOSIS — M79671 Pain in right foot: Secondary | ICD-10-CM | POA: Diagnosis not present

## 2021-03-10 DIAGNOSIS — M79674 Pain in right toe(s): Secondary | ICD-10-CM | POA: Diagnosis not present

## 2021-03-10 DIAGNOSIS — L110 Acquired keratosis follicularis: Secondary | ICD-10-CM | POA: Diagnosis not present

## 2021-03-10 DIAGNOSIS — M79675 Pain in left toe(s): Secondary | ICD-10-CM | POA: Diagnosis not present

## 2021-03-10 DIAGNOSIS — E1140 Type 2 diabetes mellitus with diabetic neuropathy, unspecified: Secondary | ICD-10-CM | POA: Diagnosis not present

## 2021-03-10 DIAGNOSIS — I739 Peripheral vascular disease, unspecified: Secondary | ICD-10-CM | POA: Diagnosis not present

## 2021-03-10 DIAGNOSIS — M79672 Pain in left foot: Secondary | ICD-10-CM | POA: Diagnosis not present

## 2021-04-12 DIAGNOSIS — K219 Gastro-esophageal reflux disease without esophagitis: Secondary | ICD-10-CM | POA: Diagnosis not present

## 2021-04-12 DIAGNOSIS — E1169 Type 2 diabetes mellitus with other specified complication: Secondary | ICD-10-CM | POA: Diagnosis not present

## 2021-04-12 DIAGNOSIS — I10 Essential (primary) hypertension: Secondary | ICD-10-CM | POA: Diagnosis not present

## 2021-04-12 DIAGNOSIS — E785 Hyperlipidemia, unspecified: Secondary | ICD-10-CM | POA: Diagnosis not present

## 2021-05-19 DIAGNOSIS — M79672 Pain in left foot: Secondary | ICD-10-CM | POA: Diagnosis not present

## 2021-05-19 DIAGNOSIS — M79671 Pain in right foot: Secondary | ICD-10-CM | POA: Diagnosis not present

## 2021-05-19 DIAGNOSIS — M79675 Pain in left toe(s): Secondary | ICD-10-CM | POA: Diagnosis not present

## 2021-05-19 DIAGNOSIS — E1140 Type 2 diabetes mellitus with diabetic neuropathy, unspecified: Secondary | ICD-10-CM | POA: Diagnosis not present

## 2021-05-19 DIAGNOSIS — M79674 Pain in right toe(s): Secondary | ICD-10-CM | POA: Diagnosis not present

## 2021-05-19 DIAGNOSIS — L110 Acquired keratosis follicularis: Secondary | ICD-10-CM | POA: Diagnosis not present

## 2021-05-19 DIAGNOSIS — I739 Peripheral vascular disease, unspecified: Secondary | ICD-10-CM | POA: Diagnosis not present

## 2021-06-07 DIAGNOSIS — E1169 Type 2 diabetes mellitus with other specified complication: Secondary | ICD-10-CM | POA: Diagnosis not present

## 2021-06-07 DIAGNOSIS — E785 Hyperlipidemia, unspecified: Secondary | ICD-10-CM | POA: Diagnosis not present

## 2021-06-07 DIAGNOSIS — I10 Essential (primary) hypertension: Secondary | ICD-10-CM | POA: Diagnosis not present

## 2021-06-07 DIAGNOSIS — E1165 Type 2 diabetes mellitus with hyperglycemia: Secondary | ICD-10-CM | POA: Diagnosis not present

## 2021-08-26 DIAGNOSIS — M79671 Pain in right foot: Secondary | ICD-10-CM | POA: Diagnosis not present

## 2021-08-26 DIAGNOSIS — E1140 Type 2 diabetes mellitus with diabetic neuropathy, unspecified: Secondary | ICD-10-CM | POA: Diagnosis not present

## 2021-08-26 DIAGNOSIS — I739 Peripheral vascular disease, unspecified: Secondary | ICD-10-CM | POA: Diagnosis not present

## 2021-08-26 DIAGNOSIS — L110 Acquired keratosis follicularis: Secondary | ICD-10-CM | POA: Diagnosis not present

## 2021-08-26 DIAGNOSIS — M79672 Pain in left foot: Secondary | ICD-10-CM | POA: Diagnosis not present

## 2021-08-26 DIAGNOSIS — M79674 Pain in right toe(s): Secondary | ICD-10-CM | POA: Diagnosis not present

## 2021-08-26 DIAGNOSIS — M79675 Pain in left toe(s): Secondary | ICD-10-CM | POA: Diagnosis not present

## 2021-08-30 DIAGNOSIS — I10 Essential (primary) hypertension: Secondary | ICD-10-CM | POA: Diagnosis not present

## 2021-08-30 DIAGNOSIS — K219 Gastro-esophageal reflux disease without esophagitis: Secondary | ICD-10-CM | POA: Diagnosis not present

## 2021-08-30 DIAGNOSIS — E785 Hyperlipidemia, unspecified: Secondary | ICD-10-CM | POA: Diagnosis not present

## 2021-08-30 DIAGNOSIS — E1169 Type 2 diabetes mellitus with other specified complication: Secondary | ICD-10-CM | POA: Diagnosis not present

## 2021-09-27 DIAGNOSIS — Z0000 Encounter for general adult medical examination without abnormal findings: Secondary | ICD-10-CM | POA: Diagnosis not present

## 2021-09-27 DIAGNOSIS — Z23 Encounter for immunization: Secondary | ICD-10-CM | POA: Diagnosis not present

## 2021-09-27 DIAGNOSIS — E1169 Type 2 diabetes mellitus with other specified complication: Secondary | ICD-10-CM | POA: Diagnosis not present

## 2021-10-07 DIAGNOSIS — M1711 Unilateral primary osteoarthritis, right knee: Secondary | ICD-10-CM | POA: Diagnosis not present

## 2021-10-07 DIAGNOSIS — M1611 Unilateral primary osteoarthritis, right hip: Secondary | ICD-10-CM | POA: Diagnosis not present

## 2021-12-01 DIAGNOSIS — Z23 Encounter for immunization: Secondary | ICD-10-CM | POA: Diagnosis not present

## 2021-12-23 DIAGNOSIS — Z20822 Contact with and (suspected) exposure to covid-19: Secondary | ICD-10-CM | POA: Diagnosis not present

## 2021-12-29 DIAGNOSIS — L110 Acquired keratosis follicularis: Secondary | ICD-10-CM | POA: Diagnosis not present

## 2021-12-29 DIAGNOSIS — M79672 Pain in left foot: Secondary | ICD-10-CM | POA: Diagnosis not present

## 2021-12-29 DIAGNOSIS — M79675 Pain in left toe(s): Secondary | ICD-10-CM | POA: Diagnosis not present

## 2021-12-29 DIAGNOSIS — M79674 Pain in right toe(s): Secondary | ICD-10-CM | POA: Diagnosis not present

## 2021-12-29 DIAGNOSIS — E1140 Type 2 diabetes mellitus with diabetic neuropathy, unspecified: Secondary | ICD-10-CM | POA: Diagnosis not present

## 2021-12-29 DIAGNOSIS — M79671 Pain in right foot: Secondary | ICD-10-CM | POA: Diagnosis not present

## 2021-12-29 DIAGNOSIS — I739 Peripheral vascular disease, unspecified: Secondary | ICD-10-CM | POA: Diagnosis not present

## 2022-01-30 DIAGNOSIS — E789 Disorder of lipoprotein metabolism, unspecified: Secondary | ICD-10-CM | POA: Diagnosis not present

## 2022-01-30 DIAGNOSIS — E1169 Type 2 diabetes mellitus with other specified complication: Secondary | ICD-10-CM | POA: Diagnosis not present

## 2022-01-30 DIAGNOSIS — I10 Essential (primary) hypertension: Secondary | ICD-10-CM | POA: Diagnosis not present

## 2022-01-30 DIAGNOSIS — K219 Gastro-esophageal reflux disease without esophagitis: Secondary | ICD-10-CM | POA: Diagnosis not present

## 2022-03-23 DIAGNOSIS — M79671 Pain in right foot: Secondary | ICD-10-CM | POA: Diagnosis not present

## 2022-03-23 DIAGNOSIS — M79675 Pain in left toe(s): Secondary | ICD-10-CM | POA: Diagnosis not present

## 2022-03-23 DIAGNOSIS — I739 Peripheral vascular disease, unspecified: Secondary | ICD-10-CM | POA: Diagnosis not present

## 2022-03-23 DIAGNOSIS — M79672 Pain in left foot: Secondary | ICD-10-CM | POA: Diagnosis not present

## 2022-03-23 DIAGNOSIS — E1140 Type 2 diabetes mellitus with diabetic neuropathy, unspecified: Secondary | ICD-10-CM | POA: Diagnosis not present

## 2022-03-23 DIAGNOSIS — M79674 Pain in right toe(s): Secondary | ICD-10-CM | POA: Diagnosis not present

## 2022-03-23 DIAGNOSIS — L110 Acquired keratosis follicularis: Secondary | ICD-10-CM | POA: Diagnosis not present

## 2022-03-31 DIAGNOSIS — M25512 Pain in left shoulder: Secondary | ICD-10-CM | POA: Diagnosis not present

## 2022-03-31 DIAGNOSIS — M25511 Pain in right shoulder: Secondary | ICD-10-CM | POA: Diagnosis not present

## 2022-06-05 DIAGNOSIS — E1169 Type 2 diabetes mellitus with other specified complication: Secondary | ICD-10-CM | POA: Diagnosis not present

## 2022-06-05 DIAGNOSIS — E785 Hyperlipidemia, unspecified: Secondary | ICD-10-CM | POA: Diagnosis not present

## 2022-06-05 DIAGNOSIS — I10 Essential (primary) hypertension: Secondary | ICD-10-CM | POA: Diagnosis not present

## 2022-10-02 DIAGNOSIS — Z23 Encounter for immunization: Secondary | ICD-10-CM | POA: Diagnosis not present

## 2022-10-02 DIAGNOSIS — E785 Hyperlipidemia, unspecified: Secondary | ICD-10-CM | POA: Diagnosis not present

## 2022-10-02 DIAGNOSIS — I10 Essential (primary) hypertension: Secondary | ICD-10-CM | POA: Diagnosis not present

## 2022-10-02 DIAGNOSIS — E1169 Type 2 diabetes mellitus with other specified complication: Secondary | ICD-10-CM | POA: Diagnosis not present

## 2022-11-23 DIAGNOSIS — H25811 Combined forms of age-related cataract, right eye: Secondary | ICD-10-CM | POA: Diagnosis not present

## 2022-12-12 ENCOUNTER — Other Ambulatory Visit: Payer: Self-pay

## 2022-12-12 ENCOUNTER — Emergency Department (HOSPITAL_COMMUNITY)
Admission: EM | Admit: 2022-12-12 | Discharge: 2022-12-12 | Disposition: A | Discharge status: Home / Self Care | Payer: Medicare Other | Attending: Emergency Medicine | Admitting: Emergency Medicine

## 2022-12-12 ENCOUNTER — Encounter (HOSPITAL_COMMUNITY): Payer: Self-pay | Admitting: *Deleted

## 2022-12-12 ENCOUNTER — Emergency Department (HOSPITAL_COMMUNITY): Payer: Medicare Other

## 2022-12-12 DIAGNOSIS — M25562 Pain in left knee: Secondary | ICD-10-CM | POA: Insufficient documentation

## 2022-12-12 DIAGNOSIS — M1712 Unilateral primary osteoarthritis, left knee: Secondary | ICD-10-CM | POA: Diagnosis not present

## 2022-12-12 MED ORDER — ACETAMINOPHEN 500 MG PO TABS
1000.0000 mg | ORAL_TABLET | Freq: Once | ORAL | Status: AC
  Administered 2022-12-12: 1000 mg via ORAL
  Filled 2022-12-12: qty 2

## 2022-12-12 NOTE — ED Triage Notes (Signed)
Pt c/o left knee pain after pushing someone in a wheelchair

## 2022-12-12 NOTE — ED Provider Notes (Signed)
[facility] EMERGENCY DEPARTMENT Provider Note   CSN: [id number] Arrival date & time: 12/12/22  3570     History Chief Complaint  Patient presents with   Knee Pain    HPI Joshua Kemp is a 66 y.o. male presenting for chief complaint of left knee pain.  He states that he was pushing a wheelchair up a hill when he had to strain and felt a popping sensation in his left knee.  He denies fevers or chills, nausea vomiting, syncope shortness of breath.  Endorses a history of arthritis follows with orthopedics in the outpatient setting.  Has not been seen in multiple years.  Otherwise ambulatory tolerating p.o. intake..   Patient's recorded medical, surgical, social, medication list and allergies were reviewed in the Snapshot window as part of the initial history.   Review of Systems   Review of Systems  Constitutional:  Negative for chills and fever.  HENT:  Negative for ear pain and sore throat.   Eyes:  Negative for pain and visual disturbance.  Respiratory:  Negative for cough and shortness of breath.   Cardiovascular:  Negative for chest pain and palpitations.  Gastrointestinal:  Negative for abdominal pain and vomiting.  Genitourinary:  Negative for dysuria and hematuria.  Musculoskeletal:  Negative for arthralgias and back pain.  Skin:  Negative for color change and rash.  Neurological:  Negative for seizures and syncope.  All other systems reviewed and are negative.   Physical Exam Updated Vital Signs BP (!) 154/88   Pulse 97   Temp 98.2 F (36.8 C) (Oral)   Resp 16   Ht '6\' 4"'$  (1.93 m)   Wt 95.7 kg   SpO2 96%   BMI 25.68 kg/m  Physical Exam Vitals and nursing note reviewed.  Constitutional:      General: He is not in acute distress.    Appearance: He is well-developed.  HENT:     Head: Normocephalic and atraumatic.  Eyes:     Conjunctiva/sclera: Conjunctivae normal.  Cardiovascular:     Rate and Rhythm: Normal rate and regular rhythm.     Heart sounds:  No murmur heard. Pulmonary:     Effort: Pulmonary effort is normal. No respiratory distress.     Breath sounds: Normal breath sounds.  Abdominal:     Palpations: Abdomen is soft.     Tenderness: There is no abdominal tenderness.  Musculoskeletal:        General: No swelling.     Cervical back: Neck supple.     Comments: Negative for anterior lateral drawer sign.  Negative posterior drawer sign.  Positive for tenderness to palpation over the left anterior knee where there is a small amount of joint swelling.   Skin:    General: Skin is warm and dry.     Capillary Refill: Capillary refill takes less than 2 seconds.  Neurological:     Mental Status: He is alert.  Psychiatric:        Mood and Affect: Mood normal.      ED Course/ Medical Decision Making/ A&P    Procedures Procedures   Medications Ordered in ED Medications  acetaminophen (TYLENOL) tablet 1,000 mg (has no administration in time range)    Medical Decision Making:    Joshua Kemp is a 66 y.o. male who presented to the ED today with left knee pain detailed above.     Patient's presentation is complicated by their history of arthritis, advanced age.  Patient placed on continuous  vitals and telemetry monitoring while in ED which was reviewed periodically.   Complete initial physical exam performed, notably the patient  was hemodynamically stable in no acute distress.      Reviewed and confirmed nursing documentation for past medical history, family history, social history.    Initial Assessment:   Patient's history of present on his physicals and findings are most consistent with benign pathology.  Likely ligamentous versus tendinous strain.  Considered underlying fracture however the seem less likely.  X-ray ordered given advanced age and demonstrates no acute pathology.  Agree with x-ray report per radiology. Recommended supportive care techniques conservative management follow-up with  orthopedics.  Disposition:  I have considered need for hospitalization, however, considering all of the above, I believe this patient is stable for discharge at this time.  Patient/family educated about specific return precautions for given chief complaint and symptoms.  Patient/family educated about follow-up with PCP.     Patient/family expressed understanding of return precautions and need for follow-up. Patient spoken to regarding all imaging and laboratory results and appropriate follow up for these results. All education provided in verbal form with additional information in written form. Time was allowed for answering of patient questions. Patient discharged.    Emergency Department Medication Summary:   Medications  acetaminophen (TYLENOL) tablet 1,000 mg (has no administration in time range)        Clinical Impression:  1. Acute pain of left knee      Discharge   Final Clinical Impression(s) / ED Diagnoses Final diagnoses:  Acute pain of left knee    Rx / DC Orders ED Discharge Orders     None         Tretha Sciara, MD 12/12/22 [phone]

## 2022-12-15 DIAGNOSIS — H269 Unspecified cataract: Secondary | ICD-10-CM | POA: Diagnosis not present

## 2022-12-15 DIAGNOSIS — H25811 Combined forms of age-related cataract, right eye: Secondary | ICD-10-CM | POA: Diagnosis not present

## 2022-12-29 DIAGNOSIS — H269 Unspecified cataract: Secondary | ICD-10-CM | POA: Diagnosis not present

## 2022-12-29 DIAGNOSIS — H2512 Age-related nuclear cataract, left eye: Secondary | ICD-10-CM | POA: Diagnosis not present

## 2023-01-30 DIAGNOSIS — I10 Essential (primary) hypertension: Secondary | ICD-10-CM | POA: Diagnosis not present

## 2023-01-30 DIAGNOSIS — E785 Hyperlipidemia, unspecified: Secondary | ICD-10-CM | POA: Diagnosis not present

## 2023-01-30 DIAGNOSIS — E7800 Pure hypercholesterolemia, unspecified: Secondary | ICD-10-CM | POA: Diagnosis not present

## 2023-01-30 DIAGNOSIS — E1169 Type 2 diabetes mellitus with other specified complication: Secondary | ICD-10-CM | POA: Diagnosis not present

## 2023-05-22 DIAGNOSIS — I10 Essential (primary) hypertension: Secondary | ICD-10-CM | POA: Diagnosis not present

## 2023-05-22 DIAGNOSIS — E7800 Pure hypercholesterolemia, unspecified: Secondary | ICD-10-CM | POA: Diagnosis not present

## 2023-05-22 DIAGNOSIS — E1169 Type 2 diabetes mellitus with other specified complication: Secondary | ICD-10-CM | POA: Diagnosis not present

## 2023-09-21 ENCOUNTER — Ambulatory Visit
Admission: EM | Admit: 2023-09-21 | Discharge: 2023-09-21 | Disposition: A | Discharge status: Home / Self Care | Payer: 59 | Attending: Family Medicine | Admitting: Family Medicine

## 2023-09-21 ENCOUNTER — Encounter: Payer: Self-pay | Admitting: Emergency Medicine

## 2023-09-21 DIAGNOSIS — K219 Gastro-esophageal reflux disease without esophagitis: Secondary | ICD-10-CM | POA: Diagnosis not present

## 2023-09-21 DIAGNOSIS — M549 Dorsalgia, unspecified: Secondary | ICD-10-CM | POA: Diagnosis not present

## 2023-09-21 MED ORDER — SIMETHICONE 80 MG PO CHEW: 80.0000 mg | CHEWABLE_TABLET | Freq: Four times a day (QID) | ORAL | 0 refills | Status: AC | PRN

## 2023-09-21 MED ORDER — ALUM & MAG HYDROXIDE-SIMETH 200-200-20 MG/5ML PO SUSP
30.0000 mL | Freq: Once | ORAL | Status: AC
  Administered 2023-09-21: 30 mL via ORAL

## 2023-09-21 MED ORDER — PANTOPRAZOLE SODIUM 40 MG PO TBEC: 40.0000 mg | DELAYED_RELEASE_TABLET | Freq: Every day | ORAL | 0 refills | Status: AC

## 2023-09-21 MED ORDER — LIDOCAINE VISCOUS HCL 2 % MT SOLN
15.0000 mL | Freq: Once | OROMUCOSAL | Status: AC
  Administered 2023-09-21: 15 mL via OROMUCOSAL

## 2023-09-21 NOTE — Discharge Instructions (Signed)
We have given you a GI cocktail today in clinic and I have prescribed a strong heartburn medicine to take daily until improved as well as a gas medicine to take as needed.  Follow-up with your primary care provider soon as possible, return sooner if worsening anytime.

## 2023-09-21 NOTE — ED Triage Notes (Signed)
Pain in left shoulder blade x 5 days.  States it hurts to breath.

## 2023-09-21 NOTE — ED Provider Notes (Signed)
[facility]    CSN: [id number] Arrival date & time: 09/21/23  1257      History   Chief Complaint No chief complaint on file.   HPI Joshua Kemp is a 67 y.o. male.   Presenting today with 5-day history of pain around the left shoulder blade.  States it is worse with deep breaths, relieved a bit by belching.  States he has had this issue in the past and it was gas pains related to his reflux.  He has been taking Rolaids at home with mild benefit.  Denies injury to the area, chest pain, shortness of breath, palpitations, vomiting, fevers.    Past Medical History:  Diagnosis Date   Arthritis    Diabetes mellitus    H/O chest wall injury    Hiatal hernia    Hypertension     Patient Active Problem List   Diagnosis Date Noted   Left inguinal hernia    Radicular leg pain 06/16/2014   Effusion of knee joint 06/16/2014   Primary osteoarthritis of left knee 06/16/2014   Arthritis of knee, degenerative 01/21/2014   Precordial pain 12/20/2012   EKG abnormalities 12/20/2012   PURE HYPERCHOLESTEROLEMIA 10/19/2009   NONDEPENDENT TOBACCO USE DISORDER 10/19/2009   ESOPHAGEAL REFLUX 10/19/2009   CHEST PAIN UNSPECIFIED 10/19/2009   PAINFUL RESPIRATION 10/19/2009    Past Surgical History:  Procedure Laterality Date   APPENDECTOMY     INGUINAL HERNIA REPAIR Left 01/14/2021   Procedure: HERNIA REPAIR INGUINAL ADULT;  Surgeon: Franky Macho, MD;  Location: [facility];  Service: General;  Laterality: Left;   ROTATOR CUFF REPAIR Left        Home Medications    Prior to Admission medications   Medication Sig Start Date End Date Taking? Authorizing Provider  pantoprazole (PROTONIX) 40 MG tablet Take 1 tablet (40 mg total) by mouth daily. 09/21/23  Yes Particia Nearing, PA-C  simethicone (MYLICON) 80 MG chewable tablet Chew 1 tablet (80 mg total) by mouth every 6 (six) hours as needed for flatulence. 09/21/23  Yes Particia Nearing, PA-C  atorvastatin (LIPITOR)  20 MG tablet Take 20 mg by mouth at bedtime.    [provider]  gabapentin (NEURONTIN) 300 MG capsule Take 300 mg by mouth daily as needed (Nerve pain).    [provider]  HYDROcodone-acetaminophen (NORCO) 10-325 MG tablet Take 1 tablet by mouth every 6 (six) hours as needed. 01/14/21   Franky Macho, MD  JANUMET 50-1000 MG tablet Take 1 tablet by mouth daily. 11/12/20   [provider]  loratadine (CLARITIN) 10 MG tablet Take 10 mg by mouth daily as needed for allergies.    [provider]  metFORMIN (GLUCOPHAGE) 1000 MG tablet Take 1,000 mg by mouth 2 (two) times daily.    [provider]  omeprazole (PRILOSEC) 20 MG capsule Take 20 mg by mouth daily.    [provider]  tamsulosin (FLOMAX) 0.4 MG CAPS capsule Take 0.4 mg by mouth at bedtime.    [provider]    Family History History reviewed. No pertinent family history.  Social History Social History   Tobacco Use   Smoking status: Former    Current packs/day: 0.00    Average packs/day: 0.5 packs/day for 30.0 years (15.0 ttl pk-yrs)    Types: Cigarettes    Start date: 01/13/1964    Quit date: 01/12/1994    Years since quitting: 29.7   Smokeless tobacco: Never  Vaping Use  Vaping status: Never Used  Substance Use Topics   Alcohol use: No   Drug use: No     Allergies   Patient has no known allergies.   Review of Systems Review of Systems Per HPI  Physical Exam Triage Vital Signs ED Triage Vitals  Encounter Vitals Group     BP 09/21/23 1443 (!) 159/67     Systolic BP Percentile --      Diastolic BP Percentile --      Pulse Rate 09/21/23 1443 66     Resp 09/21/23 1443 18     Temp 09/21/23 1443 97.7 F (36.5 C)     Temp Source 09/21/23 1443 Oral     SpO2 09/21/23 1443 96 %     Weight --      Height --      Head Circumference --      Peak Flow --      Pain Score 09/21/23 1444 7     Pain Loc --      Pain Education --      Exclude from Growth  Chart --    No data found.  Updated Vital Signs BP (!) 159/67 (BP Location: Right Arm)   Pulse 66   Temp 97.7 F (36.5 C) (Oral)   Resp 18   SpO2 96%   Visual Acuity Right Eye Distance:   Left Eye Distance:   Bilateral Distance:    Right Eye Near:   Left Eye Near:    Bilateral Near:     Physical Exam Vitals and nursing note reviewed.  Constitutional:      Appearance: Normal appearance.  HENT:     Head: Atraumatic.     Mouth/Throat:     Mouth: Mucous membranes are moist.     Pharynx: Oropharynx is clear.  Eyes:     Extraocular Movements: Extraocular movements intact.     Conjunctiva/sclera: Conjunctivae normal.  Cardiovascular:     Rate and Rhythm: Normal rate and regular rhythm.  Pulmonary:     Effort: Pulmonary effort is normal.     Breath sounds: Normal breath sounds. No wheezing or rales.  Musculoskeletal:        General: Normal range of motion.     Cervical back: Normal range of motion and neck supple.  Skin:    General: Skin is warm and dry.  Neurological:     General: No focal deficit present.     Mental Status: He is oriented to person, place, and time.     Motor: No weakness.     Gait: Gait normal.  Psychiatric:        Mood and Affect: Mood normal.        Thought Content: Thought content normal.        Judgment: Judgment normal.      UC Treatments / Results  Labs (all labs ordered are listed, but only abnormal results are displayed) Labs Reviewed - No data to display  EKG   Radiology No results found.  Procedures Procedures (including critical care time)  Medications Ordered in UC Medications  lidocaine (XYLOCAINE) 2 % viscous mouth solution 15 mL (15 mLs Mouth/Throat Given 09/21/23 1510)  alum & mag hydroxide-simeth (MAALOX/MYLANTA) 200-200-20 MG/5ML suspension 30 mL (30 mLs Oral Given 09/21/23 1510)    Initial Impression / Assessment and Plan / UC Course  I have reviewed the triage vital signs and the nursing notes.  Pertinent  labs & imaging results that were available during my care  of the patient were reviewed by me and considered in my medical decision making (see chart for details).     Mildly hypertensive in triage, otherwise vital signs within normal limits.  Offered EKG for further evaluation and patient declines further workup at this time as he is confident that the issue is more gas pain related.  States GI cocktails have helped him immediately in the past and wishes to have 1 today, this was administered prior to discharge.  Will also increase his GERD regimen with Protonix, treat gas pains with simethicone and discussed dietary changes, strict return precautions.  Final Clinical Impressions(s) / UC Diagnoses   Final diagnoses:  Upper back pain on left side  Gastroesophageal reflux disease, unspecified whether esophagitis present     Discharge Instructions      We have given you a GI cocktail today in clinic and I have prescribed a strong heartburn medicine to take daily until improved as well as a gas medicine to take as needed.  Follow-up with your primary care provider soon as possible, return sooner if worsening anytime.    ED Prescriptions     Medication Sig Dispense Auth. Provider   simethicone (MYLICON) 80 MG chewable tablet Chew 1 tablet (80 mg total) by mouth every 6 (six) hours as needed for flatulence. 30 tablet Particia Nearing, [place]   pantoprazole (PROTONIX) 40 MG tablet Take 1 tablet (40 mg total) by mouth daily. 30 tablet Particia Nearing, [place]      PDMP not reviewed this encounter.   Particia Nearing, [place] 09/21/23 1520

## 2023-09-25 DIAGNOSIS — Z23 Encounter for immunization: Secondary | ICD-10-CM | POA: Diagnosis not present

## 2023-09-25 DIAGNOSIS — E7800 Pure hypercholesterolemia, unspecified: Secondary | ICD-10-CM | POA: Diagnosis not present

## 2023-09-25 DIAGNOSIS — I10 Essential (primary) hypertension: Secondary | ICD-10-CM | POA: Diagnosis not present

## 2023-09-25 DIAGNOSIS — K219 Gastro-esophageal reflux disease without esophagitis: Secondary | ICD-10-CM | POA: Diagnosis not present

## 2023-09-25 DIAGNOSIS — E1169 Type 2 diabetes mellitus with other specified complication: Secondary | ICD-10-CM | POA: Diagnosis not present

## 2023-10-28 DIAGNOSIS — R519 Headache, unspecified: Secondary | ICD-10-CM | POA: Diagnosis not present

## 2023-10-28 DIAGNOSIS — J0140 Acute pansinusitis, unspecified: Secondary | ICD-10-CM | POA: Diagnosis not present

## 2023-11-22 DIAGNOSIS — K449 Diaphragmatic hernia without obstruction or gangrene: Secondary | ICD-10-CM | POA: Diagnosis not present

## 2023-11-22 DIAGNOSIS — K219 Gastro-esophageal reflux disease without esophagitis: Secondary | ICD-10-CM | POA: Diagnosis not present

## 2024-01-08 DIAGNOSIS — L03032 Cellulitis of left toe: Secondary | ICD-10-CM | POA: Diagnosis not present

## 2024-01-08 DIAGNOSIS — M79672 Pain in left foot: Secondary | ICD-10-CM | POA: Diagnosis not present

## 2024-01-08 DIAGNOSIS — L600 Ingrowing nail: Secondary | ICD-10-CM | POA: Diagnosis not present

## 2024-01-08 DIAGNOSIS — M79675 Pain in left toe(s): Secondary | ICD-10-CM | POA: Diagnosis not present

## 2024-01-22 DIAGNOSIS — L03032 Cellulitis of left toe: Secondary | ICD-10-CM | POA: Diagnosis not present

## 2024-01-22 DIAGNOSIS — M79672 Pain in left foot: Secondary | ICD-10-CM | POA: Diagnosis not present

## 2024-01-22 DIAGNOSIS — L600 Ingrowing nail: Secondary | ICD-10-CM | POA: Diagnosis not present

## 2024-01-28 DIAGNOSIS — K219 Gastro-esophageal reflux disease without esophagitis: Secondary | ICD-10-CM | POA: Diagnosis not present

## 2024-01-28 DIAGNOSIS — I10 Essential (primary) hypertension: Secondary | ICD-10-CM | POA: Diagnosis not present

## 2024-01-28 DIAGNOSIS — E7800 Pure hypercholesterolemia, unspecified: Secondary | ICD-10-CM | POA: Diagnosis not present

## 2024-01-28 DIAGNOSIS — E1169 Type 2 diabetes mellitus with other specified complication: Secondary | ICD-10-CM | POA: Diagnosis not present

## 2024-02-11 DIAGNOSIS — L03032 Cellulitis of left toe: Secondary | ICD-10-CM | POA: Diagnosis not present

## 2024-02-11 DIAGNOSIS — L600 Ingrowing nail: Secondary | ICD-10-CM | POA: Diagnosis not present

## 2024-02-11 DIAGNOSIS — M79675 Pain in left toe(s): Secondary | ICD-10-CM | POA: Diagnosis not present

## 2024-02-11 DIAGNOSIS — M79672 Pain in left foot: Secondary | ICD-10-CM | POA: Diagnosis not present

## 2024-03-24 DIAGNOSIS — M79674 Pain in right toe(s): Secondary | ICD-10-CM | POA: Diagnosis not present

## 2024-03-24 DIAGNOSIS — M79672 Pain in left foot: Secondary | ICD-10-CM | POA: Diagnosis not present

## 2024-03-24 DIAGNOSIS — M79671 Pain in right foot: Secondary | ICD-10-CM | POA: Diagnosis not present

## 2024-03-24 DIAGNOSIS — M79675 Pain in left toe(s): Secondary | ICD-10-CM | POA: Diagnosis not present

## 2024-03-24 DIAGNOSIS — I739 Peripheral vascular disease, unspecified: Secondary | ICD-10-CM | POA: Diagnosis not present

## 2024-03-24 DIAGNOSIS — L110 Acquired keratosis follicularis: Secondary | ICD-10-CM | POA: Diagnosis not present

## 2024-03-24 DIAGNOSIS — E1140 Type 2 diabetes mellitus with diabetic neuropathy, unspecified: Secondary | ICD-10-CM | POA: Diagnosis not present

## 2024-06-03 DIAGNOSIS — K219 Gastro-esophageal reflux disease without esophagitis: Secondary | ICD-10-CM | POA: Diagnosis not present

## 2024-06-03 DIAGNOSIS — I10 Essential (primary) hypertension: Secondary | ICD-10-CM | POA: Diagnosis not present

## 2024-06-03 DIAGNOSIS — E7800 Pure hypercholesterolemia, unspecified: Secondary | ICD-10-CM | POA: Diagnosis not present

## 2024-06-03 DIAGNOSIS — M19011 Primary osteoarthritis, right shoulder: Secondary | ICD-10-CM | POA: Diagnosis not present

## 2024-06-03 DIAGNOSIS — E1169 Type 2 diabetes mellitus with other specified complication: Secondary | ICD-10-CM | POA: Diagnosis not present

## 2024-06-12 DIAGNOSIS — E1140 Type 2 diabetes mellitus with diabetic neuropathy, unspecified: Secondary | ICD-10-CM | POA: Diagnosis not present

## 2024-06-12 DIAGNOSIS — M79672 Pain in left foot: Secondary | ICD-10-CM | POA: Diagnosis not present

## 2024-06-12 DIAGNOSIS — M79675 Pain in left toe(s): Secondary | ICD-10-CM | POA: Diagnosis not present

## 2024-06-12 DIAGNOSIS — L600 Ingrowing nail: Secondary | ICD-10-CM | POA: Diagnosis not present

## 2024-06-12 DIAGNOSIS — L03032 Cellulitis of left toe: Secondary | ICD-10-CM | POA: Diagnosis not present

## 2024-06-26 DIAGNOSIS — L600 Ingrowing nail: Secondary | ICD-10-CM | POA: Diagnosis not present

## 2024-06-26 DIAGNOSIS — M79675 Pain in left toe(s): Secondary | ICD-10-CM | POA: Diagnosis not present

## 2024-06-26 DIAGNOSIS — M79672 Pain in left foot: Secondary | ICD-10-CM | POA: Diagnosis not present

## 2024-06-26 DIAGNOSIS — L03032 Cellulitis of left toe: Secondary | ICD-10-CM | POA: Diagnosis not present

## 2024-07-07 DIAGNOSIS — I739 Peripheral vascular disease, unspecified: Secondary | ICD-10-CM | POA: Diagnosis not present

## 2024-07-07 DIAGNOSIS — E1140 Type 2 diabetes mellitus with diabetic neuropathy, unspecified: Secondary | ICD-10-CM | POA: Diagnosis not present

## 2024-07-07 DIAGNOSIS — M79671 Pain in right foot: Secondary | ICD-10-CM | POA: Diagnosis not present

## 2024-07-07 DIAGNOSIS — L110 Acquired keratosis follicularis: Secondary | ICD-10-CM | POA: Diagnosis not present

## 2024-07-07 DIAGNOSIS — M79675 Pain in left toe(s): Secondary | ICD-10-CM | POA: Diagnosis not present

## 2024-07-07 DIAGNOSIS — M79672 Pain in left foot: Secondary | ICD-10-CM | POA: Diagnosis not present

## 2024-07-07 DIAGNOSIS — M79674 Pain in right toe(s): Secondary | ICD-10-CM | POA: Diagnosis not present

## 2024-09-15 DIAGNOSIS — J0110 Acute frontal sinusitis, unspecified: Secondary | ICD-10-CM | POA: Diagnosis not present

## 2024-10-06 DIAGNOSIS — I10 Essential (primary) hypertension: Secondary | ICD-10-CM | POA: Diagnosis not present

## 2024-10-06 DIAGNOSIS — E7800 Pure hypercholesterolemia, unspecified: Secondary | ICD-10-CM | POA: Diagnosis not present

## 2024-10-06 DIAGNOSIS — E1165 Type 2 diabetes mellitus with hyperglycemia: Secondary | ICD-10-CM | POA: Diagnosis not present

## 2024-10-13 DIAGNOSIS — M79675 Pain in left toe(s): Secondary | ICD-10-CM | POA: Diagnosis not present

## 2024-10-13 DIAGNOSIS — E1140 Type 2 diabetes mellitus with diabetic neuropathy, unspecified: Secondary | ICD-10-CM | POA: Diagnosis not present

## 2024-10-13 DIAGNOSIS — M79672 Pain in left foot: Secondary | ICD-10-CM | POA: Diagnosis not present

## 2024-10-13 DIAGNOSIS — M79674 Pain in right toe(s): Secondary | ICD-10-CM | POA: Diagnosis not present

## 2024-10-13 DIAGNOSIS — L110 Acquired keratosis follicularis: Secondary | ICD-10-CM | POA: Diagnosis not present

## 2024-10-13 DIAGNOSIS — I739 Peripheral vascular disease, unspecified: Secondary | ICD-10-CM | POA: Diagnosis not present

## 2024-10-13 DIAGNOSIS — M79671 Pain in right foot: Secondary | ICD-10-CM | POA: Diagnosis not present
